# Patient Record
Sex: Female | Born: 1965 | Race: Black or African American | Hispanic: No | Marital: Married | Smoking: Never smoker
Health system: Southern US, Community
[De-identification: ages and names within clinical notes are randomized; demographics above are authoritative.]

## PROBLEM LIST (undated history)

## (undated) DIAGNOSIS — Z789 Other specified health status: Secondary | ICD-10-CM

## (undated) HISTORY — PX: TUBAL LIGATION: SHX77

---

## 1996-09-16 HISTORY — PX: FOOT SURGERY: SHX648

## 1997-09-16 HISTORY — PX: TUBAL LIGATION: SHX77

## 1998-02-06 ENCOUNTER — Other Ambulatory Visit: Admission: RE | Admit: 1998-02-06 | Discharge: 1998-02-06 | Payer: Self-pay | Admitting: Obstetrics & Gynecology

## 1998-06-21 ENCOUNTER — Inpatient Hospital Stay (HOSPITAL_COMMUNITY): Admission: AD | Admit: 1998-06-21 | Discharge: 1998-06-21 | Payer: Self-pay | Admitting: Obstetrics and Gynecology

## 1998-08-21 ENCOUNTER — Inpatient Hospital Stay (HOSPITAL_COMMUNITY): Admission: AD | Admit: 1998-08-21 | Discharge: 1998-08-23 | Payer: Self-pay | Admitting: *Deleted

## 1998-08-28 ENCOUNTER — Ambulatory Visit (HOSPITAL_COMMUNITY): Admission: RE | Admit: 1998-08-28 | Discharge: 1998-08-28 | Payer: Self-pay | Admitting: Obstetrics and Gynecology

## 1998-08-28 ENCOUNTER — Encounter: Payer: Self-pay | Admitting: Obstetrics and Gynecology

## 1999-02-19 ENCOUNTER — Other Ambulatory Visit: Admission: RE | Admit: 1999-02-19 | Discharge: 1999-02-19 | Payer: Self-pay | Admitting: *Deleted

## 1999-03-30 ENCOUNTER — Ambulatory Visit (HOSPITAL_COMMUNITY): Admission: RE | Admit: 1999-03-30 | Discharge: 1999-03-30 | Payer: Self-pay | Admitting: *Deleted

## 2003-04-06 ENCOUNTER — Emergency Department (HOSPITAL_COMMUNITY): Admission: EM | Admit: 2003-04-06 | Discharge: 2003-04-06 | Payer: Self-pay | Admitting: *Deleted

## 2003-05-10 ENCOUNTER — Encounter: Payer: Self-pay | Admitting: Family Medicine

## 2003-05-10 ENCOUNTER — Encounter: Admission: RE | Admit: 2003-05-10 | Discharge: 2003-05-10 | Payer: Self-pay | Admitting: Family Medicine

## 2005-11-21 ENCOUNTER — Other Ambulatory Visit: Admission: RE | Admit: 2005-11-21 | Discharge: 2005-11-21 | Payer: Self-pay | Admitting: Obstetrics and Gynecology

## 2010-10-06 ENCOUNTER — Encounter: Payer: Self-pay | Admitting: Family Medicine

## 2011-08-03 ENCOUNTER — Emergency Department (HOSPITAL_COMMUNITY)

## 2011-08-03 ENCOUNTER — Encounter: Payer: Self-pay | Admitting: *Deleted

## 2011-08-03 ENCOUNTER — Emergency Department (HOSPITAL_COMMUNITY)
Admission: EM | Admit: 2011-08-03 | Discharge: 2011-08-03 | Disposition: A | Discharge status: Home / Self Care | Attending: Emergency Medicine | Admitting: Emergency Medicine

## 2011-08-03 DIAGNOSIS — M25519 Pain in unspecified shoulder: Secondary | ICD-10-CM | POA: Insufficient documentation

## 2011-08-03 MED ORDER — ONDANSETRON HCL 4 MG/2ML IJ SOLN
4.0000 mg | Freq: Once | INTRAMUSCULAR | Status: AC
  Administered 2011-08-03: 4 mg via INTRAVENOUS
  Filled 2011-08-03: qty 2

## 2011-08-03 MED ORDER — HYDROMORPHONE HCL PF 1 MG/ML IJ SOLN
1.0000 mg | Freq: Once | INTRAMUSCULAR | Status: AC
  Administered 2011-08-03: 1 mg via INTRAVENOUS
  Filled 2011-08-03: qty 1

## 2011-08-03 MED ORDER — SODIUM CHLORIDE 0.9 % IV SOLN
Freq: Once | INTRAVENOUS | Status: AC
  Administered 2011-08-03: 19:00:00 via INTRAVENOUS

## 2011-08-03 MED ORDER — TETANUS-DIPHTH-ACELL PERTUSSIS 5-2.5-18.5 LF-MCG/0.5 IM SUSP
0.5000 mL | Freq: Once | INTRAMUSCULAR | Status: DC
  Filled 2011-08-03: qty 0.5

## 2011-08-03 NOTE — ED Notes (Signed)
Ice pack to affected area given for comfort
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
## 2011-08-03 NOTE — Progress Notes (Signed)
Orthopedic Tech Progress Note Patient Details:  EARLY ORD 24-Aug-1966 [id number]  Other Ortho Devices Ortho Device Location: applied shoulder immobilizer to (R) UE Ortho Device Interventions: Application   Jennye Moccasin 08/03/2011, 8:20 PM
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
## 2011-08-03 NOTE — ED Notes (Signed)
Patient transported to X-ray 

## 2011-08-04 NOTE — ED Provider Notes (Signed)
Medical screening examination/treatment/procedure(s) were performed by non-physician practitioner and as supervising physician I was immediately available for consultation/collaboration.  Flint Melter, MD 08/04/11 2118

## 2012-02-25 ENCOUNTER — Ambulatory Visit: Payer: Self-pay | Admitting: Obstetrics and Gynecology

## 2012-07-08 ENCOUNTER — Telehealth: Payer: Self-pay | Admitting: Obstetrics and Gynecology

## 2012-07-08 NOTE — Telephone Encounter (Signed)
Spoke with pt rgd msg pt states positive upt had btl 14 yrs ago wants eval py has appt 07/09/12 at 10:00 with vph pt voice understanding

## 2012-07-09 ENCOUNTER — Encounter: Payer: Self-pay | Admitting: Obstetrics and Gynecology

## 2012-07-09 ENCOUNTER — Telehealth: Payer: Self-pay | Admitting: Obstetrics and Gynecology

## 2012-07-09 ENCOUNTER — Other Ambulatory Visit: Payer: Self-pay | Admitting: Obstetrics and Gynecology

## 2012-07-09 ENCOUNTER — Ambulatory Visit (INDEPENDENT_AMBULATORY_CARE_PROVIDER_SITE_OTHER): Admitting: Obstetrics and Gynecology

## 2012-07-09 ENCOUNTER — Ambulatory Visit (INDEPENDENT_AMBULATORY_CARE_PROVIDER_SITE_OTHER)

## 2012-07-09 VITALS — BP 104/70 | Resp 16 | Ht 61.0 in | Wt 136.0 lb

## 2012-07-09 DIAGNOSIS — D219 Benign neoplasm of connective and other soft tissue, unspecified: Secondary | ICD-10-CM

## 2012-07-09 DIAGNOSIS — Z3201 Encounter for pregnancy test, result positive: Secondary | ICD-10-CM

## 2012-07-09 DIAGNOSIS — N912 Amenorrhea, unspecified: Secondary | ICD-10-CM

## 2012-07-09 DIAGNOSIS — D259 Leiomyoma of uterus, unspecified: Secondary | ICD-10-CM

## 2012-07-09 LAB — HCG, QUANTITATIVE, PREGNANCY: hCG, Beta Chain, Quant, S: <1 m[IU]/mL

## 2012-07-09 MED ORDER — MEDROXYPROGESTERONE ACETATE 5 MG PO TABS: 5.0000 mg | ORAL_TABLET | Freq: Every day | ORAL | Status: DC

## 2012-07-09 NOTE — Patient Instructions (Signed)
Uterine Fibroid  A uterine fibroid is a growth (tumor) that occurs in a woman's uterus. This type of tumor is not cancerous and does not spread out of the uterus. A woman can have one or many fibroids, and the fiboid(s) can become quite large. A fibroid can vary in size, weight, and where it grows in the uterus. Most fibroids do not require medical treatment, but some can cause pain or heavy bleeding during and between periods.  CAUSES   A fibroid is the result of a single uterine cell that keeps growing (unregulated), which is different than most cells in the human body. Most cells have a control mechanism that keeps them from reproducing without control.   SYMPTOMS    Bleeding.   Pelvic pain and pressure.   Bladder problems due to the size of the fibroid.   Infertility and miscarriages depending on the size and location of the fibroid.  DIAGNOSIS   A diagnosis is made by physical exam. Your caregiver may feel the lumpy tumors during a pelvic exam. Important information regarding size, location, and number of tumors can be gained by having an ultrasound. It is rare that other tests, such as a CT scan or MRI, are needed.  TREATMENT    Your caregiver may recommend watchful waiting. This involves getting the fibroid checked by your caregiver to see if the fibroids grow or shrink.    Hormonal treatment or an intrauterine device (IUD) may be prescribed.    Surgery may be needed to remove the fibroids (myomectomy) or the uterus (hysterectomy). This depends on your situation.  When fibroids interfere with fertility and a woman wants to become pregnant, a caregiver may recommend having the fibroids removed.   HOME CARE INSTRUCTIONS   Home care depends on how you were treated. In general:    Keep all follow-up appointments with your caregiver.    Only take medicine as told by your caregiver. Do not take aspirin. It can cause bleeding.    If you have excessive periods and soak tampons or pads in a half hour or  less, contact your caregiver immediately. If your periods are troublesome but not so heavy, lie down with your feet raised slightly above your heart. Place cold packs on your lower abdomen.    If your periods are heavy, write down the number of pads or tampons you use per month. Bring this information to your caregiver.    Talk to your caregiver about taking iron pills.    Include green vegetables in your diet.    If you were prescribed a hormonal treatment, take the hormonal medicines as directed.    If you need surgery, ask your caregiver for information on your specific surgery.   SEEK IMMEDIATE MEDICAL CARE IF:   You have pelvic pain or cramps not controlled with medicines.    You have a sudden increase in pelvic pain.    You have an increase of bleeding between and during periods.    You feel lightheaded or have fainting episodes.   MAKE SURE YOU:   Understand these instructions.   Will watch your condition.   Will get help right away if you are not doing well or get worse.  Document Released: 08/30/2000 Document Revised: 11/25/2011 Document Reviewed: 09/23/2011  [employer] Patient Information 2013 [employer].
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
## 2012-07-09 NOTE — Progress Notes (Signed)
Patient ID: Cassandra Willis, female   DOB: [birth year], 46 y.o.   MRN: [id number] Pt is here today because she had + UPT. However, the one done here today was negative. LMP was 06/12/2012. Pt had BTL many years ago.

## 2012-07-09 NOTE — Progress Notes (Signed)
GYN PROBLEM VISIT  Ms. Cassandra Willis is a 46 y.o. year old female, , who presents for a problem visit.   Subjective: Has always had nl regular cycles.  Now one week late.  Had pos HPT, then neg.  S/p BTL.  14 YRS ago    Objective:  BP 104/70  Resp 16  Ht 5\' 1"  (1.549 m)  Wt 136 lb (61.689 kg)  BMI 25.70 kg/m2  LMP 06/12/2012     External genitalia: normal general appearance Vaginal: normal rugae Cervix: normal appearance Adnexa: no masses Uterus: irregular enlargement 10 weeks size, mobile and irregular  ULTRASOUND: Uterus: Length: 9.97 cm   Width:  6.35 cm   Height:  6.98 cm  Endo thickness:  5.29 mm   Left ovary:Normal Right ovary:Normal Fibroids:yes  Number:  5  Size5.54cm  Largest diameter CDS fluid:no QHCG= <1.0  Assessment:  Oligomenorrhea Asymptomatic fibroid  Plan: Provera 5mg  for 5 days if no menses by 07/17/12  Return to office 3 mos for aex   Dierdre Forth, MD  07/09/2012 3:08 PM

## 2012-08-12 ENCOUNTER — Encounter: Admitting: Obstetrics and Gynecology

## 2014-04-06 ENCOUNTER — Ambulatory Visit (INDEPENDENT_AMBULATORY_CARE_PROVIDER_SITE_OTHER): Admitting: Family Medicine

## 2014-04-06 VITALS — BP 118/78 | HR 87 | Temp 98.0°F | Resp 16 | Ht 63.0 in | Wt 157.0 lb

## 2014-04-06 DIAGNOSIS — R209 Unspecified disturbances of skin sensation: Secondary | ICD-10-CM

## 2014-04-06 DIAGNOSIS — R202 Paresthesia of skin: Secondary | ICD-10-CM

## 2014-04-06 DIAGNOSIS — R300 Dysuria: Secondary | ICD-10-CM

## 2014-04-06 DIAGNOSIS — R8281 Pyuria: Secondary | ICD-10-CM

## 2014-04-06 DIAGNOSIS — R200 Anesthesia of skin: Secondary | ICD-10-CM

## 2014-04-06 DIAGNOSIS — R82998 Other abnormal findings in urine: Secondary | ICD-10-CM

## 2014-04-06 DIAGNOSIS — R109 Unspecified abdominal pain: Secondary | ICD-10-CM

## 2014-04-06 LAB — POCT URINALYSIS DIPSTICK
Bilirubin, UA: NEGATIVE
Glucose, UA: NEGATIVE
Nitrite, UA: POSITIVE
Protein, UA: 30
Spec Grav, UA: 1.02
Urobilinogen, UA: 0.2
pH, UA: 5

## 2014-04-06 LAB — POCT UA - MICROSCOPIC ONLY
Casts, Ur, LPF, POC: NEGATIVE
Crystals, Ur, HPF, POC: NEGATIVE
Mucus, UA: NEGATIVE
Yeast, UA: NEGATIVE

## 2014-04-06 MED ORDER — CIPROFLOXACIN HCL 500 MG PO TABS: 500.0000 mg | ORAL_TABLET | Freq: Two times a day (BID) | ORAL | Status: DC

## 2014-04-06 MED ORDER — PREDNISONE 20 MG PO TABS: 40.0000 mg | ORAL_TABLET | Freq: Every day | ORAL | Status: DC

## 2014-04-06 NOTE — Progress Notes (Signed)
Called in.

## 2014-04-06 NOTE — Progress Notes (Signed)
This chart was scribed for Robyn Haber, MD by Einar Pheasant, ED Scribe. This patient was seen in room 2 and the patient's care was started at 7:28 PM.  Patient ID: Cassandra Willis MRN: [id number], DOB: [birth year], 48 y.o. Date of Encounter: 04/06/2014, 7:26 PM  Primary Physician: Elyn Peers, MD  Chief Complaint:  Chief Complaint  Patient presents with   Back Pain    right side x 2 days   Urinary Frequency    x 3 weeks   Numbness    right knee x 2 months     HPI: 48 y.o. 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 Pt states that the urgency increase has been waking her up at night. She is also complaining of associated back pain that started 2 days ago. Pt states that the pain was so bad when it started that it was painful to the touch. She reports taking an Advil with relief. Denies any abdominal pain, nausea, emesis, chest pain, or SOB.    Pt is also complaining of right knee numbness that started 2 months ago. She states that when she puts pressure on its painful. Pt states that she has to lay on the left side of her body because its uncomfortable. Denies any recent injury or falls.    History reviewed. No pertinent past medical history.   Home Meds: Prior to Admission medications   Medication Sig Start Date End Date Taking? Authorizing Provider  medroxyPROGESTERone (PROVERA) 5 MG tablet Take 1 tablet (5 mg total) by mouth daily. 07/09/12   Eldred Manges, MD    Allergies: Not on File  History   Social History   Marital Status: Unknown    Spouse Name: N/A    Number of Children: N/A   Years of Education: N/A   Occupational History   Not on file.   Social History Main Topics   Smoking status: Never Smoker    Smokeless tobacco: Not on file   Alcohol Use: Not on file   Drug Use: Not on file   Sexual Activity: Not on file   Other  Topics Concern   Not on file   Social History Narrative   No narrative on file     Review of Systems: positive for arthralgias, urinary frequency, and back pain.  Constitutional: negative for chills, fever, night sweats, weight changes, or fatigue  HEENT: negative for vision changes, hearing loss, congestion, rhinorrhea, ST, epistaxis, or sinus pressure Cardiovascular: negative for chest pain or palpitations Respiratory: negative for hemoptysis, wheezing, shortness of breath, or cough Abdominal: negative for abdominal pain, nausea, vomiting, diarrhea, or constipation Dermatological: negative for rash Neurologic: negative for headache, dizziness, or syncope All other systems reviewed and are otherwise negative with the exception to those above and in the HPI.   Physical Exam: Blood pressure 118/78, pulse 87, temperature 98 F (36.7 C), temperature source Oral, resp. rate 16, height 5\' 3"  (1.6 m), weight 157 lb (71.215 kg), last menstrual period 03/17/2014, SpO2 98.00%., Body mass index is 27.82 kg/(m^2). General: Well developed, well nourished, in no acute distress. Head: Normocephalic, atraumatic, eyes without discharge, sclera non-icteric, nares are without discharge. Bilateral auditory canals clear, TM's are without perforation, pearly grey and translucent with reflective cone of light bilaterally. Oral cavity moist, posterior pharynx without exudate, erythema, peritonsillar abscess, or post nasal drip.  Neck: Supple. No thyromegaly. Full ROM. No lymphadenopathy.  Lungs: Clear bilaterally to auscultation without wheezes, rales, or rhonchi. Breathing is unlabored. Heart: RRR with S1 S2. No murmurs, rubs, or gallops appreciated. Abdomen: Soft, non-tender, non-distended with normoactive bowel sounds. No hepatomegaly. No rebound/guarding. No obvious abdominal masses. Msk:  Strength and tone normal for age. Extremities/Skin: Warm and dry. No clubbing or cyanosis. No edema. No rashes or  suspicious lesions. Neuro: Alert and oriented X 3. Moves all extremities spontaneously. Gait is normal. CNII-XII grossly in tact. Psych:  Responds to questions appropriately with a normal affect.   Results for orders placed in visit on 04/06/14  POCT URINALYSIS DIPSTICK      Result Value Ref Range   Color, UA yellow     Clarity, UA cloudy     Glucose, UA neg     Bilirubin, UA neg     Ketones, UA trace     Spec Grav, UA 1.020     Blood, UA trace     pH, UA 5.0     Protein, UA 30     Urobilinogen, UA 0.2     Nitrite, UA positive     Leukocytes, UA moderate (2+)    POCT UA - MICROSCOPIC ONLY      Result Value Ref Range   WBC, Ur, HPF, POC 25-30     RBC, urine, microscopic 1-3     Bacteria, U Microscopic 1+     Mucus, UA neg     Epithelial cells, urine per micros 1-3     Crystals, Ur, HPF, POC neg     Casts, Ur, LPF, POC neg     Yeast, UA neg      ASSESSMENT AND PLAN:  48 y.o. year old female with  Dysuria - Plan: POCT urinalysis dipstick, POCT UA - Microscopic Only, ciprofloxacin (CIPRO) 500 MG tablet  Pyuria - Plan: ciprofloxacin (CIPRO) 500 MG tablet  Numbness and tingling of both legs below knees - Plan: predniSONE (DELTASONE) 20 MG tablet  Flank pain - Plan: predniSONE (DELTASONE) 20 MG tablet      I personally performed the services described in this documentation, which was scribed in my presence. The recorded information has been reviewed and is accurate.  Signed, Robyn Haber, MD 04/06/2014 7:26 PM

## 2014-04-06 NOTE — Patient Instructions (Signed)

## 2014-04-09 LAB — URINE CULTURE: Colony Count: >=100000

## 2014-11-22 ENCOUNTER — Emergency Department (HOSPITAL_COMMUNITY): Payer: 59

## 2014-11-22 ENCOUNTER — Encounter (HOSPITAL_COMMUNITY): Payer: Self-pay | Admitting: *Deleted

## 2014-11-22 ENCOUNTER — Emergency Department (HOSPITAL_COMMUNITY)
Admission: EM | Admit: 2014-11-22 | Discharge: 2014-11-22 | Disposition: A | Discharge status: Home / Self Care | Payer: 59 | Attending: Emergency Medicine | Admitting: Emergency Medicine

## 2014-11-22 DIAGNOSIS — Y9389 Activity, other specified: Secondary | ICD-10-CM | POA: Insufficient documentation

## 2014-11-22 DIAGNOSIS — Z3202 Encounter for pregnancy test, result negative: Secondary | ICD-10-CM | POA: Diagnosis not present

## 2014-11-22 DIAGNOSIS — S40012A Contusion of left shoulder, initial encounter: Secondary | ICD-10-CM | POA: Insufficient documentation

## 2014-11-22 DIAGNOSIS — S199XXA Unspecified injury of neck, initial encounter: Secondary | ICD-10-CM | POA: Insufficient documentation

## 2014-11-22 DIAGNOSIS — S4992XA Unspecified injury of left shoulder and upper arm, initial encounter: Secondary | ICD-10-CM | POA: Diagnosis present

## 2014-11-22 DIAGNOSIS — Y998 Other external cause status: Secondary | ICD-10-CM | POA: Insufficient documentation

## 2014-11-22 DIAGNOSIS — Y92410 Unspecified street and highway as the place of occurrence of the external cause: Secondary | ICD-10-CM | POA: Insufficient documentation

## 2014-11-22 LAB — POC URINE PREG, ED: Preg Test, Ur: NEGATIVE

## 2014-11-22 MED ORDER — NAPROXEN 375 MG PO TABS: 375.0000 mg | ORAL_TABLET | Freq: Two times a day (BID) | ORAL | Status: DC

## 2014-11-22 MED ORDER — CYCLOBENZAPRINE HCL 10 MG PO TABS
5.0000 mg | ORAL_TABLET | Freq: Once | ORAL | Status: AC
  Administered 2014-11-22: 5 mg via ORAL
  Filled 2014-11-22: qty 1

## 2014-11-22 MED ORDER — CYCLOBENZAPRINE HCL 5 MG PO TABS: 5.0000 mg | ORAL_TABLET | Freq: Two times a day (BID) | ORAL | Status: DC | PRN

## 2014-11-22 MED ORDER — HYDROCODONE-ACETAMINOPHEN 5-325 MG PO TABS: 1.0000 | ORAL_TABLET | ORAL | Status: DC | PRN
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
## 2016-06-20 ENCOUNTER — Encounter (HOSPITAL_COMMUNITY): Payer: Self-pay | Admitting: *Deleted

## 2018-02-24 ENCOUNTER — Other Ambulatory Visit: Payer: Self-pay

## 2018-02-24 ENCOUNTER — Encounter (HOSPITAL_COMMUNITY): Payer: Self-pay

## 2018-02-24 ENCOUNTER — Emergency Department (HOSPITAL_COMMUNITY)
Admission: EM | Admit: 2018-02-24 | Discharge: 2018-02-24 | Disposition: A | Discharge status: Home / Self Care | Attending: Emergency Medicine | Admitting: Emergency Medicine

## 2018-02-24 DIAGNOSIS — G8929 Other chronic pain: Secondary | ICD-10-CM | POA: Diagnosis not present

## 2018-02-24 DIAGNOSIS — M545 Low back pain: Secondary | ICD-10-CM | POA: Diagnosis not present

## 2018-02-24 DIAGNOSIS — M549 Dorsalgia, unspecified: Secondary | ICD-10-CM | POA: Diagnosis present

## 2018-02-24 LAB — URINALYSIS, ROUTINE W REFLEX MICROSCOPIC
Bilirubin Urine: NEGATIVE
Glucose, UA: NEGATIVE mg/dL
HGB URINE DIPSTICK: NEGATIVE
Ketones, ur: NEGATIVE mg/dL
LEUKOCYTES UA: NEGATIVE
NITRITE: NEGATIVE
PROTEIN: NEGATIVE mg/dL
SPECIFIC GRAVITY, URINE: 1.015 (ref 1.005–1.030)
pH: 6 (ref 5.0–8.0)

## 2018-02-24 LAB — CBC WITH DIFFERENTIAL/PLATELET
BASOS ABS: 0 10*3/uL (ref 0.0–0.1)
Basophils Relative: 1 %
Eosinophils Absolute: 0.2 10*3/uL (ref 0.0–0.7)
Eosinophils Relative: 3 %
HEMATOCRIT: 40.8 % (ref 36.0–46.0)
Hemoglobin: 14 g/dL (ref 12.0–15.0)
LYMPHS ABS: 1.9 10*3/uL (ref 0.7–4.0)
Lymphocytes Relative: 30 %
MCH: 30.8 pg (ref 26.0–34.0)
MCHC: 34.3 g/dL (ref 30.0–36.0)
MCV: 89.7 fL (ref 78.0–100.0)
MONO ABS: 0.5 10*3/uL (ref 0.1–1.0)
Monocytes Relative: 8 %
NEUTROS ABS: 3.8 10*3/uL (ref 1.7–7.7)
Neutrophils Relative %: 58 %
Platelets: 309 10*3/uL (ref 150–400)
RBC: 4.55 MIL/uL (ref 3.87–5.11)
RDW: 13.3 % (ref 11.5–15.5)
WBC: 6.4 10*3/uL (ref 4.0–10.5)

## 2018-02-24 LAB — COMPREHENSIVE METABOLIC PANEL
ALBUMIN: 4.4 g/dL (ref 3.5–5.0)
ALT: 29 U/L (ref 14–54)
ANION GAP: 7 (ref 5–15)
AST: 26 U/L (ref 15–41)
Alkaline Phosphatase: 84 U/L (ref 38–126)
BILIRUBIN TOTAL: 0.4 mg/dL (ref 0.3–1.2)
BUN: 14 mg/dL (ref 6–20)
CHLORIDE: 106 mmol/L (ref 101–111)
CO2: 28 mmol/L (ref 22–32)
Calcium: 9.4 mg/dL (ref 8.9–10.3)
Creatinine, Ser: 0.97 mg/dL (ref 0.44–1.00)
GFR calc Af Amer: >60 mL/min (ref >60)
GFR calc non Af Amer: >60 mL/min (ref >60)
GLUCOSE: 106 mg/dL — AB (ref 65–99)
Potassium: 4.1 mmol/L (ref 3.5–5.1)
SODIUM: 141 mmol/L (ref 135–145)
TOTAL PROTEIN: 8 g/dL (ref 6.5–8.1)

## 2018-02-24 LAB — I-STAT BETA HCG BLOOD, ED (MC, WL, AP ONLY)

## 2018-02-24 NOTE — ED Provider Notes (Signed)
[facility] DEPT Provider Note   CSN: [id number] Arrival date & time: 02/24/18  1211     History   Chief Complaint Chief Complaint  Patient presents with  . Flank Pain    HPI Cassandra Willis is a 52 y.o. female.  52 yo female presents with complaint of right mid back pain x 1 year. Patient reports daily aching pain, worse with movement and lying in bed, improves with taking Aleve, does not radiate. Reports urinary frequency without dysuria. Also concerned her eyes look yellow and she has dark circles around her eyes. No other complaints or concerns.      History reviewed. No pertinent past medical history.  Patient Active Problem List   Diagnosis Date Noted  . Amenorrhea 07/09/2012    Past Surgical History:  Procedure Laterality Date  . FOOT SURGERY  1998  . TUBAL LIGATION  1999  . TUBAL LIGATION       OB History   None      Home Medications    Prior to Admission medications   Medication Sig Start Date End Date Taking? Authorizing Provider  naproxen sodium (ALEVE) 220 MG tablet Take 440 mg by mouth 2 (two) times daily as needed (back pain).   Yes [provider]    Family History No family history on file.  Social History Social History   Tobacco Use  . Smoking status: Never Smoker  . Smokeless tobacco: Never Used  Substance Use Topics  . Alcohol use: No  . Drug use: No     Allergies   Patient has no known allergies.   Review of Systems Review of Systems  Constitutional: Negative for fever.  Gastrointestinal: Negative for abdominal pain, constipation, diarrhea, nausea and vomiting.  Genitourinary: Positive for frequency. Negative for difficulty urinating and dysuria.  Musculoskeletal: Positive for back pain.  Skin: Negative for rash and wound.  Allergic/Immunologic: Negative for immunocompromised state.  Neurological: Negative for dizziness and weakness.  Hematological: Does not bruise/bleed  easily.  Psychiatric/Behavioral: Negative for confusion.  All other systems reviewed and are negative.    Physical Exam Updated Vital Signs BP (!) 124/97 (BP Location: Right Arm)   Pulse 61   Temp 98 F (36.7 C) (Oral)   Resp 18   Ht 5\' 1"  (1.549 m)   Wt 90.7 kg (200 lb)   SpO2 100%   BMI 37.79 kg/m   Physical Exam  Constitutional: She is oriented to person, place, and time. She appears well-developed and well-nourished.  HENT:  Head: Normocephalic.  Eyes: Conjunctivae are normal.  Cardiovascular: Normal rate, regular rhythm, normal heart sounds and intact distal pulses.  No murmur heard. Pulmonary/Chest: Effort normal and breath sounds normal. No respiratory distress.  Abdominal: Soft. She exhibits no distension. There is no tenderness.  Musculoskeletal: She exhibits tenderness.       Lumbar back: She exhibits tenderness. She exhibits no bony tenderness.       Back:  Neurological: She is alert and oriented to person, place, and time.  Skin: Skin is warm and dry. Capillary refill takes less than 2 seconds. No rash noted.  Psychiatric: She has a normal mood and affect. Her behavior is normal.  Nursing note and vitals reviewed.    ED Treatments / Results  Labs (all labs ordered are listed, but only abnormal results are displayed) Labs Reviewed  COMPREHENSIVE METABOLIC PANEL - Abnormal; Notable for the following components:      Result Value   Glucose, Bld 106 (*)  All other components within normal limits  URINALYSIS, ROUTINE W REFLEX MICROSCOPIC  CBC WITH DIFFERENTIAL/PLATELET  I-STAT BETA HCG BLOOD, ED ([facility], [facility], [facility] ONLY)    EKG None  Radiology No results found.  Procedures Procedures (including critical care time)  Medications Ordered in ED Medications - No data to display   Initial Impression / Assessment and Plan / ED Course  I have reviewed the triage vital signs and the nursing notes.  Pertinent labs & imaging results that were available during  my care of the patient were reviewed by me and considered in my medical decision making (see chart for details).  Clinical Course as of Feb 24 1745  Tue Feb 24, 2018  1611 52yo female with right mid/low back pain x 1 year without injury, concerned she may have a kidney infection, also concerned due to yellow look her her eyes and dark circles around the eyes. On exam, right low back TTP, no midline tenderness. UA normal, will check CBC/CMP for patient's concerns for yellowing of the eyes not appreciated on exam.    [LM]  1611 Labs normal, patient will be dc to follow up with PCP for further evaluation.    [LM]    Clinical Course User Index [LM] Tacy Learn, PA-C      Final Clinical Impressions(s) / ED Diagnoses   Final diagnoses:  Chronic right-sided low back pain without sciatica    ED Discharge Orders    None       Tacy Learn, PA-C 02/24/18 1746    Mesner, Corene Cornea, MD 02/25/18 1641

## 2018-02-24 NOTE — ED Triage Notes (Addendum)
Right sided flank pain. Pt states that it's been waking her up. Pt concerned for kidney infection. No frequency/burning during urination. Pt states also some yellow in and around her eyes.

## 2018-02-24 NOTE — Discharge Instructions (Signed)
Continue with Naproxen (Aleve) as needed as directed for pain. Follow up with your GYN or contact your insurance provider as discussed for help locating PCP.  Discuss follow up with GYN/PCP- physical therapy or other treatment for your pain.

## 2018-11-20 ENCOUNTER — Ambulatory Visit (HOSPITAL_BASED_OUTPATIENT_CLINIC_OR_DEPARTMENT_OTHER)
Admit: 2018-11-20 | Discharge: 2018-11-20 | Disposition: A | Discharge status: Home / Self Care | Attending: Cardiology | Admitting: Cardiology

## 2018-11-20 ENCOUNTER — Ambulatory Visit (HOSPITAL_COMMUNITY)
Admission: EM | Admit: 2018-11-20 | Discharge: 2018-11-20 | Disposition: A | Discharge status: Home / Self Care | Attending: Family Medicine | Admitting: Family Medicine

## 2018-11-20 ENCOUNTER — Encounter (HOSPITAL_COMMUNITY): Payer: Self-pay | Admitting: Emergency Medicine

## 2018-11-20 ENCOUNTER — Ambulatory Visit (INDEPENDENT_AMBULATORY_CARE_PROVIDER_SITE_OTHER)

## 2018-11-20 DIAGNOSIS — M25461 Effusion, right knee: Secondary | ICD-10-CM

## 2018-11-20 DIAGNOSIS — M25561 Pain in right knee: Secondary | ICD-10-CM | POA: Diagnosis not present

## 2018-11-20 DIAGNOSIS — M79604 Pain in right leg: Secondary | ICD-10-CM | POA: Insufficient documentation

## 2018-11-20 DIAGNOSIS — M7989 Other specified soft tissue disorders: Secondary | ICD-10-CM | POA: Diagnosis not present

## 2018-11-20 DIAGNOSIS — M79609 Pain in unspecified limb: Secondary | ICD-10-CM

## 2018-11-20 MED ORDER — MELOXICAM 7.5 MG PO TABS: 7.5000 mg | ORAL_TABLET | Freq: Every day | ORAL | 0 refills | Status: DC

## 2018-11-20 NOTE — ED Provider Notes (Signed)
[place]    CSN: [id number] Arrival date & time: 11/20/18  1330     History   Chief Complaint Chief Complaint  Patient presents with  . Leg Pain    HPI Cassandra Willis is a 53 y.o. female.   Cassandra Willis presents with complaints of right knee pain and right calf pain. Symptoms initially started 3 weeks ago with generalized right pain which localized to right lateral knee. This progressed to posterior knee pain and right calf pain and swelling. She noted bruising to posterior knee and visible swelling. No numbness or tingling. Has been taking tylenol which hasn't helped. No known injury. No cough or shortness of breath , no chest pain . No recent travel, no recent hospitalization, not on any hormone therapy. Doesn't smoke. No history of blood clots. Pain is worse with weight bearing. States knee pain is worse when she wakes in the morning, it is sharp. Improves with activity. Denies any previous similar. No skin breakdown or lesions. Without contributing medical history.      ROS per HPI, negative if not otherwise mentioned.      History reviewed. No pertinent past medical history.  Patient Active Problem List   Diagnosis Date Noted  . Amenorrhea 07/09/2012    Past Surgical History:  Procedure Laterality Date  . FOOT SURGERY  1998  . TUBAL LIGATION  1999  . TUBAL LIGATION      OB History   No obstetric history on file.      Home Medications    Prior to Admission medications   Medication Sig Start Date End Date Taking? Authorizing Provider  meloxicam (MOBIC) 7.5 MG tablet Take 1 tablet (7.5 mg total) by mouth daily. 11/20/18   Zigmund Gottron, NP  naproxen sodium (ALEVE) 220 MG tablet Take 440 mg by mouth 2 (two) times daily as needed (back pain).    [provider]    Family History Family History  Problem Relation Age of Onset  . Hypertension Mother     Social History Social History   Tobacco Use  . Smoking status: Never Smoker   . Smokeless tobacco: Never Used  Substance Use Topics  . Alcohol use: No  . Drug use: No     Allergies   Patient has no known allergies.   Review of Systems Review of Systems   Physical Exam Triage Vital Signs ED Triage Vitals [11/20/18 1424]  Enc Vitals Group     BP (!) 147/65     Pulse Rate 82     Resp 18     Temp 98.3 F (36.8 C)     Temp Source Temporal     SpO2 100 %     Weight      Height      Head Circumference      Peak Flow      Pain Score 7     Pain Loc      Pain Edu?      Excl. in [place]?    No data found.  Updated Vital Signs BP (!) 147/65 (BP Location: Right Arm)   Pulse 82   Temp 98.3 F (36.8 C) (Temporal)   Resp 18   SpO2 100%    Physical Exam Constitutional:      General: She is not in acute distress.    Appearance: She is well-developed.  Cardiovascular:     Rate and Rhythm: Normal rate and regular rhythm.     Heart sounds: Normal heart  sounds.  Pulmonary:     Effort: Pulmonary effort is normal. No respiratory distress.     Breath sounds: Normal breath sounds.  Musculoskeletal:     Right knee: She exhibits bony tenderness. She exhibits normal range of motion, no swelling, no laceration and no LCL laxity. Tenderness found. Lateral joint line tenderness noted.     Right lower leg: She exhibits tenderness and swelling. She exhibits no bony tenderness, no deformity and no laceration. No edema.     Comments: Bruising noted to right posterior fossa of right knee with tenderness to right lateral knee; pain with weight bearing noted with altered gait; tenderness to proximal calf with mild swelling noted; no pain with foot dorsiflexion; no redness, warmth or firmness; circumference of right calf 16.2 inch, 15.75 to left  Skin:    General: Skin is warm and dry.  Neurological:     Mental Status: She is alert and oriented to person, place, and time.      UC Treatments / Results  Labs (all labs ordered are listed, but only abnormal results are  displayed) Labs Reviewed - No data to display  EKG None  Radiology Vas [place] Lower Extremity Venous (dvt)  Result Date: 11/20/2018  Lower Venous Study Indications: Pain.  Performing Technologist: Oliver Hum RVT  Examination Guidelines: A complete evaluation includes B-mode imaging, spectral Doppler, color Doppler, and power Doppler as needed of all accessible portions of each vessel. Bilateral testing is considered an integral part of a complete examination. Limited examinations for reoccurring indications may be performed as noted.  Right Venous Findings: +---------+---------------+---------+-----------+----------+-------+          CompressibilityPhasicitySpontaneityPropertiesSummary +---------+---------------+---------+-----------+----------+-------+ CFV      Full           Yes      Yes                          +---------+---------------+---------+-----------+----------+-------+ SFJ      Full                                                 +---------+---------------+---------+-----------+----------+-------+ FV Prox  Full                                                 +---------+---------------+---------+-----------+----------+-------+ FV Mid   Full                                                 +---------+---------------+---------+-----------+----------+-------+ FV DistalFull                                                 +---------+---------------+---------+-----------+----------+-------+ PFV      Full                                                 +---------+---------------+---------+-----------+----------+-------+  POP      Full           Yes      Yes                          +---------+---------------+---------+-----------+----------+-------+ PTV      Full                                                 +---------+---------------+---------+-----------+----------+-------+ PERO     Full                                                  +---------+---------------+---------+-----------+----------+-------+  Left Venous Findings: +---+---------------+---------+-----------+----------+-------+    CompressibilityPhasicitySpontaneityPropertiesSummary +---+---------------+---------+-----------+----------+-------+ CFVFull           Yes      Yes                          +---+---------------+---------+-----------+----------+-------+    Summary: Right: There is no evidence of deep vein thrombosis in the lower extremity. No cystic structure found in the popliteal fossa. Intramuscular fluid of an unknown etiology. Left: No evidence of common femoral vein obstruction.  *See table(s) above for measurements and observations.    Preliminary    Dg Knee Ap/lat W/sunrise Right  Result Date: 11/20/2018 CLINICAL DATA:  Pain and swelling EXAM: RIGHT KNEE 3 VIEWS COMPARISON:  None. FINDINGS: No fracture or malalignment. Joint spaces are maintained. Small knee effusion IMPRESSION: No acute osseous abnormality.  Small knee effusion Electronically Signed   By: Donavan Foil M.D.   On: 11/20/2018 15:52    Procedures Procedures (including critical care time)  Medications Ordered in UC Medications - No data to display  Initial Impression / Assessment and Plan / UC Course  I have reviewed the triage vital signs and the nursing notes.  Pertinent labs & imaging results that were available during my care of the patient were reviewed by me and considered in my medical decision making (see chart for details).     Negative DVT study but with fluid to musculature. Strain vs ruptured bakers cyst? Small effusion to right knee. Ice, elevation, nsaids. Ace wraps provided. Follow up with ortho as needed. Results discussed with patient and agreeable to plan.    Final Clinical Impressions(s) / UC Diagnoses   Final diagnoses:  Right leg pain     Discharge Instructions     Please go now to get your leg ultrasound completed.  I will call you with  any concerning findings with your xray.  If negative ultrasound please start daily meloxicam, ice, elevation.  May follow up with orthopedics as needed.  We will notify you if any positive findings on ultrasound.      ED Prescriptions    Medication Sig Dispense Auth. Provider   meloxicam (MOBIC) 7.5 MG tablet Take 1 tablet (7.5 mg total) by mouth daily. 20 tablet Zigmund Gottron, NP     Controlled Substance Prescriptions [place] Controlled Substance Registry consulted? Not Applicable   Zigmund Gottron, NP 11/20/18 [id number]

## 2018-11-20 NOTE — Discharge Instructions (Addendum)
Please go now to get your leg ultrasound completed.  I will call you with any concerning findings with your xray.  If negative ultrasound please start daily meloxicam, ice, elevation.  May follow up with orthopedics as needed.  We will notify you if any positive findings on ultrasound.

## 2018-11-20 NOTE — ED Triage Notes (Signed)
Pt sts right leg pain x 3 weeks starting with pain in knee then moved around to back of knee and now down calf

## 2018-11-20 NOTE — Progress Notes (Signed)
Right lower extremity venous duplex has been completed. Preliminary results can be found in CV Proc through chart review.  Results were given to [facility] at [employer] office.  11/20/18 4:40 PM Cassandra Willis RVT

## 2019-06-21 NOTE — Pre-Procedure Instructions (Signed)
[facility] [phone] - [place], [place] - [place] [address] Phone: [phone] Fax: [phone]  [id number] - [address], [place] [address] Phone: [phone] Fax: [phone]  [facility] [place], [place] [address] Phone: [phone] Fax: [phone]      Your procedure is scheduled on 06-29-19  Report to [facility] Main Entrance "A" at 0530 A.M., and check in at the Admitting office.  Call this number if you have problems the morning of surgery:  [phone]  Call [phone] if you have any questions prior to your surgery date Monday-Friday 8am-4pm    Remember:  Do not eat after midnight the night before your surgery  You may drink clear liquids until 0430AM the morning of your surgery.   Clear liquids allowed are: Water, Non-Citrus Juices (without pulp), Carbonated Beverages, Clear Tea, Black Coffee Only, and Gatorade  Please complete your PRE-SURGERY ENSURE that was provided to you by 0430AM. the morning of surgery.  Please, if able, drink it in one setting. DO NOT SIP.    Take these medicines the morning of surgery with A SIP OF WATER : acetaminophen (TYLENOL)as needed  7 days prior to surgery STOP taking any MOBIC , Aspirin (unless otherwise instructed by your surgeon), Aleve, Naproxen, Ibuprofen, Motrin, Advil, Goody's, BC's, all herbal medications, fish oil, and all vitamins.    The Morning of Surgery  Do not wear jewelry, make-up or nail polish.  Do not wear lotions, powders, or perfumes, or deodorant  Do not shave 48 hours prior to surgery.    Do not bring valuables to the hospital.  [facility] is not responsible for any belongings or valuables.  If you are a smoker, DO NOT Smoke 24 hours prior to surgery IF you wear a  CPAP at night please bring your mask, tubing, and machine the morning of surgery   Remember that you must have someone to transport you home after your surgery, and remain with you for 24 hours if you are discharged the same day.   Contacts, glasses, hearing aids, dentures or bridgework may not be worn into surgery.    Leave your suitcase in the car.  After surgery it may be brought to your room.  For patients admitted to the hospital, discharge time will be determined by your treatment team.  Patients discharged the day of surgery will not be allowed to drive home.    Special instructions:   [facility]- Preparing For Surgery  Before surgery, you can play an important role. Because skin is not sterile, your skin needs to be as free of germs as possible. You can reduce the number of germs on your skin by washing with CHG (chlorahexidine gluconate) Soap before surgery.  CHG is an antiseptic cleaner which kills germs and bonds with the skin to continue killing germs even after washing.    Oral Hygiene is also important to reduce your risk of infection.  Remember - BRUSH YOUR TEETH THE MORNING OF SURGERY WITH YOUR REGULAR TOOTHPASTE  Please do not use if you have an allergy to CHG or antibacterial soaps. If your skin becomes reddened/irritated stop using the CHG.  Do not shave (including legs and underarms) for at least 48 hours prior to first CHG shower. It  is OK to shave your face.  Please follow these instructions carefully.   1. Shower the NIGHT BEFORE SURGERY and the MORNING OF SURGERY with CHG Soap.   2. If you chose to wash your hair, wash your hair first as usual with your normal shampoo.  3. After you shampoo, rinse your hair and body thoroughly to remove the shampoo.  4. Use CHG as you would any other liquid soap. You can apply CHG directly to the skin and wash gently with a scrungie or a clean washcloth.   5. Apply the CHG Soap to your body ONLY FROM THE NECK DOWN.  Do not  use on open wounds or open sores. Avoid contact with your eyes, ears, mouth and genitals (private parts). Wash Face and genitals (private parts)  with your normal soap.   6. Wash thoroughly, paying special attention to the area where your surgery will be performed.  7. Thoroughly rinse your body with warm water from the neck down.  8. DO NOT shower/wash with your normal soap after using and rinsing off the CHG Soap.  9. Pat yourself dry with a CLEAN TOWEL.  10. Wear CLEAN PAJAMAS to bed the night before surgery, wear comfortable clothes the morning of surgery  11. Place CLEAN SHEETS on your bed the night of your first shower and DO NOT SLEEP WITH PETS.    Day of Surgery:  Do not apply any deodorants/lotions. Please shower the morning of surgery with the CHG soap  Please wear clean clothes to the hospital/surgery center.   Remember to brush your teeth WITH YOUR REGULAR TOOTHPASTE.   Please read over the  fact sheets that you were given.

## 2019-06-22 ENCOUNTER — Other Ambulatory Visit: Payer: Self-pay

## 2019-06-22 ENCOUNTER — Encounter (HOSPITAL_COMMUNITY)
Admission: RE | Admit: 2019-06-22 | Discharge: 2019-06-22 | Disposition: A | Discharge status: Home / Self Care | Source: Ambulatory Visit | Attending: Obstetrics and Gynecology | Admitting: Obstetrics and Gynecology

## 2019-06-22 ENCOUNTER — Encounter (HOSPITAL_COMMUNITY): Payer: Self-pay

## 2019-06-22 DIAGNOSIS — Z01812 Encounter for preprocedural laboratory examination: Secondary | ICD-10-CM | POA: Diagnosis not present

## 2019-06-22 DIAGNOSIS — D219 Benign neoplasm of connective and other soft tissue, unspecified: Secondary | ICD-10-CM

## 2019-06-22 HISTORY — DX: Other specified health status: Z78.9

## 2019-06-22 LAB — CBC
HCT: 43.1 % (ref 36.0–46.0)
Hemoglobin: 14.7 g/dL (ref 12.0–15.0)
MCH: 30.7 pg (ref 26.0–34.0)
MCHC: 34.1 g/dL (ref 30.0–36.0)
MCV: 90 fL (ref 80.0–100.0)
Platelets: 346 10*3/uL (ref 150–400)
RBC: 4.79 MIL/uL (ref 3.87–5.11)
RDW: 13.1 % (ref 11.5–15.5)
WBC: 4.7 10*3/uL (ref 4.0–10.5)
nRBC: 0 % (ref 0.0–0.2)

## 2019-06-22 LAB — TYPE AND SCREEN
ABO/RH(D): B POS
Antibody Screen: NEGATIVE

## 2019-06-22 LAB — ABO/RH: ABO/RH(D): B POS

## 2019-06-25 ENCOUNTER — Other Ambulatory Visit (HOSPITAL_COMMUNITY)
Admission: RE | Admit: 2019-06-25 | Discharge: 2019-06-25 | Disposition: A | Discharge status: Home / Self Care | Source: Ambulatory Visit | Attending: Obstetrics and Gynecology | Admitting: Obstetrics and Gynecology

## 2019-06-25 DIAGNOSIS — Z20828 Contact with and (suspected) exposure to other viral communicable diseases: Secondary | ICD-10-CM | POA: Diagnosis not present

## 2019-06-25 DIAGNOSIS — Z01812 Encounter for preprocedural laboratory examination: Secondary | ICD-10-CM | POA: Diagnosis not present

## 2019-06-26 LAB — NOVEL CORONAVIRUS, NAA (HOSP ORDER, SEND-OUT TO REF LAB; TAT 18-24 HRS): SARS-CoV-2, NAA: NOT DETECTED

## 2019-06-28 NOTE — Anesthesia Preprocedure Evaluation (Addendum)
Anesthesia Evaluation  Patient identified by MRN, date of birth, ID band Patient awake    Reviewed: Allergy & Precautions, NPO status , Patient's Chart, lab work & pertinent test results  Airway Mallampati: II  TM Distance: >3 FB Neck ROM: Full    Dental  (+) Dental Advisory Given   Pulmonary neg pulmonary ROS,    Pulmonary exam normal breath sounds clear to auscultation       Cardiovascular negative cardio ROS Normal cardiovascular exam Rhythm:Regular Rate:Normal     Neuro/Psych negative neurological ROS  negative psych ROS   GI/Hepatic negative GI ROS, Neg liver ROS,   Endo/Other  Morbid obesity  Renal/GU negative Renal ROS     Musculoskeletal negative musculoskeletal ROS (+)   Abdominal (+) + obese,   Peds  Hematology negative hematology ROS (+)   Anesthesia Other Findings   Reproductive/Obstetrics negative OB ROS                           Anesthesia Physical Anesthesia Plan  ASA: III  Anesthesia Plan: General   Post-op Pain Management:    Induction: Intravenous  PONV Risk Score and Plan: 4 or greater and Ondansetron, Treatment may vary due to age or medical condition, Dexamethasone and Midazolam  Airway Management Planned: Oral ETT  Additional Equipment: None  Intra-op Plan:   Post-operative Plan: Extubation in OR  Informed Consent: I have reviewed the patients History and Physical, chart, labs and discussed the procedure including the risks, benefits and alternatives for the proposed anesthesia with the patient or authorized representative who has indicated his/her understanding and acceptance.     Dental advisory given  Plan Discussed with: CRNA  Anesthesia Plan Comments:        Anesthesia Quick Evaluation

## 2019-06-28 NOTE — H&P (Addendum)
Cassandra Willis is an 53 y.o. female presenting for surgical mngt of symptomatic fibroids.      Menstrual History: No LMP recorded. (Menstrual status: Other).    Past Medical History:  Diagnosis Date  . Medical history non-contributory     Past Surgical History:  Procedure Laterality Date  . FOOT SURGERY  1998  . TUBAL LIGATION  1999  . TUBAL LIGATION      Family History  Problem Relation Age of Onset  . Hypertension Mother     Social History:  reports that she has never smoked. She has never used smokeless tobacco. She reports that she does not drink alcohol or use drugs.  Allergies: No Known Allergies  No medications prior to admission.    ROS  AF, VSS Physical Exam  Gen - NAD CV - RRR Lungs - clear Abd - soft, NT PV - uterus enlarged, 18-20 wks  PV [place]:  Multiple (>10)  fibroids - the largest measuring 6.0 x 6.3cm.  Simple left ovarian cyst.    Assessment/Plan:  Fibroids TAH/BS - pt understands risks of bleeding, infection, damage to surrounding organs.  Discussed risk of blood transfusion. Discussed risk of vertical incision.   R/b/a discussed, questions answered, informed consent obtained.    Cassandra Willis 06/28/2019, 4:18 PM

## 2019-06-29 ENCOUNTER — Inpatient Hospital Stay (HOSPITAL_COMMUNITY): Admitting: Certified Registered"

## 2019-06-29 ENCOUNTER — Encounter (HOSPITAL_COMMUNITY): Payer: Self-pay | Admitting: General Practice

## 2019-06-29 ENCOUNTER — Encounter (HOSPITAL_COMMUNITY)
Admission: RE | Disposition: A | Discharge status: Home / Self Care | Payer: Self-pay | Source: Home / Self Care | Attending: Obstetrics and Gynecology

## 2019-06-29 ENCOUNTER — Inpatient Hospital Stay (HOSPITAL_COMMUNITY)
Admission: RE | Admit: 2019-06-29 | Discharge: 2019-06-30 | DRG: 743 | Disposition: A | Discharge status: Home / Self Care | Attending: Obstetrics and Gynecology | Admitting: Obstetrics and Gynecology

## 2019-06-29 ENCOUNTER — Other Ambulatory Visit: Payer: Self-pay

## 2019-06-29 DIAGNOSIS — Z6839 Body mass index (BMI) 39.0-39.9, adult: Secondary | ICD-10-CM | POA: Diagnosis not present

## 2019-06-29 DIAGNOSIS — D259 Leiomyoma of uterus, unspecified: Secondary | ICD-10-CM | POA: Diagnosis present

## 2019-06-29 DIAGNOSIS — D219 Benign neoplasm of connective and other soft tissue, unspecified: Secondary | ICD-10-CM | POA: Diagnosis present

## 2019-06-29 HISTORY — PX: ABDOMINAL HYSTERECTOMY: SHX81

## 2019-06-29 LAB — POCT PREGNANCY, URINE: Preg Test, Ur: NEGATIVE

## 2019-06-29 SURGERY — HYSTERECTOMY, ABDOMINAL
Anesthesia: General | Site: Abdomen | Laterality: Bilateral

## 2019-06-29 MED ORDER — DEXAMETHASONE SODIUM PHOSPHATE 10 MG/ML IJ SOLN
INTRAMUSCULAR | Status: AC
  Filled 2019-06-29: qty 1

## 2019-06-29 MED ORDER — HEMOSTATIC AGENTS (NO CHARGE) OPTIME
TOPICAL | Status: DC | PRN
  Administered 2019-06-29: 1 via TOPICAL

## 2019-06-29 MED ORDER — OXYCODONE HCL 5 MG PO TABS: 5.0000 mg | ORAL_TABLET | Freq: Once | ORAL | Status: DC | PRN

## 2019-06-29 MED ORDER — ROCURONIUM BROMIDE 10 MG/ML (PF) SYRINGE
PREFILLED_SYRINGE | INTRAVENOUS | Status: AC
  Filled 2019-06-29: qty 10

## 2019-06-29 MED ORDER — NALOXONE HCL 0.4 MG/ML IJ SOLN: 0.4000 mg | INTRAMUSCULAR | Status: DC | PRN

## 2019-06-29 MED ORDER — ONDANSETRON HCL 4 MG/2ML IJ SOLN
INTRAMUSCULAR | Status: DC | PRN
  Administered 2019-06-29: 4 mg via INTRAVENOUS

## 2019-06-29 MED ORDER — HYDROMORPHONE HCL 1 MG/ML IJ SOLN
0.5000 mg | INTRAMUSCULAR | Status: DC | PRN
  Administered 2019-06-29: 0.5 mg via INTRAVENOUS
  Filled 2019-06-29: qty 1

## 2019-06-29 MED ORDER — ROCURONIUM BROMIDE 10 MG/ML (PF) SYRINGE
PREFILLED_SYRINGE | INTRAVENOUS | Status: DC | PRN
  Administered 2019-06-29: 60 mg via INTRAVENOUS

## 2019-06-29 MED ORDER — PROPOFOL 10 MG/ML IV BOLUS
INTRAVENOUS | Status: AC
  Filled 2019-06-29: qty 20

## 2019-06-29 MED ORDER — LIDOCAINE 2% (20 MG/ML) 5 ML SYRINGE
INTRAMUSCULAR | Status: AC
  Filled 2019-06-29: qty 5

## 2019-06-29 MED ORDER — DOCUSATE SODIUM 100 MG PO CAPS
100.0000 mg | ORAL_CAPSULE | Freq: Two times a day (BID) | ORAL | Status: DC
  Administered 2019-06-29 – 2019-06-30 (×2): 100 mg via ORAL
  Filled 2019-06-29 (×2): qty 1

## 2019-06-29 MED ORDER — DIPHENHYDRAMINE HCL 50 MG/ML IJ SOLN: 12.5000 mg | Freq: Four times a day (QID) | INTRAMUSCULAR | Status: DC | PRN

## 2019-06-29 MED ORDER — OXYCODONE-ACETAMINOPHEN 5-325 MG PO TABS
1.0000 | ORAL_TABLET | ORAL | Status: DC | PRN
  Administered 2019-06-29: 12:00:00 2 via ORAL
  Filled 2019-06-29: qty 2

## 2019-06-29 MED ORDER — DEXTROSE IN LACTATED RINGERS 5 % IV SOLN
INTRAVENOUS | Status: DC
  Administered 2019-06-29 – 2019-06-30 (×3): via INTRAVENOUS

## 2019-06-29 MED ORDER — ONDANSETRON HCL 4 MG PO TABS: 4.0000 mg | ORAL_TABLET | Freq: Four times a day (QID) | ORAL | Status: DC | PRN

## 2019-06-29 MED ORDER — SODIUM CHLORIDE 0.9 % IR SOLN
Status: DC | PRN
  Administered 2019-06-29: 2000 mL

## 2019-06-29 MED ORDER — PROMETHAZINE HCL 25 MG/ML IJ SOLN: 6.2500 mg | INTRAMUSCULAR | Status: DC | PRN

## 2019-06-29 MED ORDER — SUGAMMADEX SODIUM 500 MG/5ML IV SOLN
INTRAVENOUS | Status: DC | PRN
  Administered 2019-06-29: 190 mg via INTRAVENOUS

## 2019-06-29 MED ORDER — LACTATED RINGERS IV SOLN
INTRAVENOUS | Status: DC | PRN
  Administered 2019-06-29: 07:00:00 via INTRAVENOUS

## 2019-06-29 MED ORDER — SIMETHICONE 80 MG PO CHEW: 80.0000 mg | CHEWABLE_TABLET | Freq: Four times a day (QID) | ORAL | Status: DC | PRN

## 2019-06-29 MED ORDER — ONDANSETRON HCL 4 MG/2ML IJ SOLN
4.0000 mg | Freq: Four times a day (QID) | INTRAMUSCULAR | Status: DC | PRN
  Filled 2019-06-29: qty 2

## 2019-06-29 MED ORDER — KETOROLAC TROMETHAMINE 30 MG/ML IJ SOLN
30.0000 mg | Freq: Three times a day (TID) | INTRAMUSCULAR | Status: DC
  Administered 2019-06-29 – 2019-06-30 (×4): 30 mg via INTRAVENOUS
  Filled 2019-06-29 (×3): qty 1

## 2019-06-29 MED ORDER — MORPHINE SULFATE 2 MG/ML IV SOLN
INTRAVENOUS | Status: DC
  Administered 2019-06-29: 14:00:00 via INTRAVENOUS
  Administered 2019-06-29: 18 mg via INTRAVENOUS
  Administered 2019-06-30: 0 mg via INTRAVENOUS
  Administered 2019-06-30: 2 mg via INTRAVENOUS
  Filled 2019-06-29 (×2): qty 30

## 2019-06-29 MED ORDER — PROPOFOL 10 MG/ML IV BOLUS
INTRAVENOUS | Status: DC | PRN
  Administered 2019-06-29: 200 mg via INTRAVENOUS

## 2019-06-29 MED ORDER — SODIUM CHLORIDE 0.9 % IV SOLN
2.0000 g | INTRAVENOUS | Status: AC
  Administered 2019-06-29: 2 g via INTRAVENOUS
  Filled 2019-06-29: qty 2

## 2019-06-29 MED ORDER — KETOROLAC TROMETHAMINE 30 MG/ML IJ SOLN
INTRAMUSCULAR | Status: AC
  Administered 2019-06-29: 15:00:00 30 mg
  Filled 2019-06-29: qty 1

## 2019-06-29 MED ORDER — FENTANYL CITRATE (PF) 250 MCG/5ML IJ SOLN
INTRAMUSCULAR | Status: AC
  Filled 2019-06-29: qty 5

## 2019-06-29 MED ORDER — MENTHOL 3 MG MT LOZG: 1.0000 | LOZENGE | OROMUCOSAL | Status: DC | PRN

## 2019-06-29 MED ORDER — SODIUM CHLORIDE 0.9% FLUSH: 9.0000 mL | INTRAVENOUS | Status: DC | PRN

## 2019-06-29 MED ORDER — MIDAZOLAM HCL 2 MG/2ML IJ SOLN
INTRAMUSCULAR | Status: AC
  Filled 2019-06-29: qty 2

## 2019-06-29 MED ORDER — ONDANSETRON HCL 4 MG/2ML IJ SOLN
INTRAMUSCULAR | Status: AC
  Filled 2019-06-29: qty 2

## 2019-06-29 MED ORDER — MEPERIDINE HCL 25 MG/ML IJ SOLN: 6.2500 mg | INTRAMUSCULAR | Status: DC | PRN

## 2019-06-29 MED ORDER — HYDROMORPHONE HCL 1 MG/ML IJ SOLN
0.2500 mg | INTRAMUSCULAR | Status: DC | PRN
  Administered 2019-06-29: 10:00:00 0.5 mg via INTRAVENOUS

## 2019-06-29 MED ORDER — HYDROMORPHONE HCL 1 MG/ML IJ SOLN
INTRAMUSCULAR | Status: AC
  Filled 2019-06-29: qty 1

## 2019-06-29 MED ORDER — DIPHENHYDRAMINE HCL 12.5 MG/5ML PO ELIX: 12.5000 mg | ORAL_SOLUTION | Freq: Four times a day (QID) | ORAL | Status: DC | PRN

## 2019-06-29 MED ORDER — CHLORHEXIDINE GLUCONATE CLOTH 2 % EX PADS
6.0000 | MEDICATED_PAD | Freq: Every day | CUTANEOUS | Status: DC
  Administered 2019-06-29: 18:00:00 6 via TOPICAL

## 2019-06-29 MED ORDER — ONDANSETRON HCL 4 MG/2ML IJ SOLN
4.0000 mg | Freq: Four times a day (QID) | INTRAMUSCULAR | Status: DC | PRN
  Administered 2019-06-29: 4 mg via INTRAVENOUS

## 2019-06-29 MED ORDER — LIDOCAINE 2% (20 MG/ML) 5 ML SYRINGE
INTRAMUSCULAR | Status: DC | PRN
  Administered 2019-06-29: 100 mg via INTRAVENOUS

## 2019-06-29 MED ORDER — OXYCODONE HCL 5 MG/5ML PO SOLN: 5.0000 mg | Freq: Once | ORAL | Status: DC | PRN

## 2019-06-29 MED ORDER — DEXAMETHASONE SODIUM PHOSPHATE 10 MG/ML IJ SOLN
INTRAMUSCULAR | Status: DC | PRN
  Administered 2019-06-29: 5 mg via INTRAVENOUS

## 2019-06-29 MED ORDER — LACTATED RINGERS IV SOLN
INTRAVENOUS | Status: DC
  Administered 2019-06-29: 07:00:00 via INTRAVENOUS

## 2019-06-29 MED ORDER — OXYCODONE HCL 5 MG PO TABS
ORAL_TABLET | ORAL | Status: AC
  Filled 2019-06-29: qty 1

## 2019-06-29 MED ORDER — FENTANYL CITRATE (PF) 250 MCG/5ML IJ SOLN
INTRAMUSCULAR | Status: DC | PRN
  Administered 2019-06-29: 50 ug via INTRAVENOUS
  Administered 2019-06-29: 100 ug via INTRAVENOUS
  Administered 2019-06-29 (×2): 50 ug via INTRAVENOUS

## 2019-06-29 MED ORDER — KETOROLAC TROMETHAMINE 30 MG/ML IJ SOLN: 30.0000 mg | Freq: Once | INTRAMUSCULAR | Status: DC | PRN

## 2019-06-29 MED ORDER — MIDAZOLAM HCL 5 MG/5ML IJ SOLN
INTRAMUSCULAR | Status: DC | PRN
  Administered 2019-06-29: 2 mg via INTRAVENOUS

## 2019-06-29 SURGICAL SUPPLY — 37 items
CANISTER SUCT 3000ML PPV (MISCELLANEOUS) ×3 IMPLANT
COVER WAND RF STERILE (DRAPES) ×3 IMPLANT
DERMABOND ADVANCED (GAUZE/BANDAGES/DRESSINGS) ×2
DERMABOND ADVANCED .7 DNX12 (GAUZE/BANDAGES/DRESSINGS) ×1 IMPLANT
DRAPE CESAREAN BIRTH W POUCH (DRAPES) ×3 IMPLANT
DRAPE WARM FLUID 44X44 (DRAPES) ×3 IMPLANT
DRSG OPSITE POSTOP 4X10 (GAUZE/BANDAGES/DRESSINGS) ×3 IMPLANT
DURAPREP 26ML APPLICATOR (WOUND CARE) ×3 IMPLANT
GAUZE 4X4 16PLY RFD (DISPOSABLE) IMPLANT
GAUZE SPONGE 4X4 12PLY STRL LF (GAUZE/BANDAGES/DRESSINGS) ×3 IMPLANT
GLOVE BIO SURGEON STRL SZ 6.5 (GLOVE) ×2 IMPLANT
GLOVE BIO SURGEONS STRL SZ 6.5 (GLOVE) ×1
GLOVE BIOGEL PI IND STRL 7.0 (GLOVE) ×3 IMPLANT
GLOVE BIOGEL PI INDICATOR 7.0 (GLOVE) ×6
GOWN STRL REUS W/ TWL LRG LVL3 (GOWN DISPOSABLE) ×3 IMPLANT
GOWN STRL REUS W/TWL LRG LVL3 (GOWN DISPOSABLE) ×6
HIBICLENS CHG 4% 4OZ BTL (MISCELLANEOUS) ×3 IMPLANT
KIT TURNOVER KIT B (KITS) ×3 IMPLANT
NS IRRIG 1000ML POUR BTL (IV SOLUTION) ×3 IMPLANT
PACK ABDOMINAL GYN (CUSTOM PROCEDURE TRAY) ×3 IMPLANT
PAD ABD 7.5X8 STRL (GAUZE/BANDAGES/DRESSINGS) ×3 IMPLANT
PAD ARMBOARD 7.5X6 YLW CONV (MISCELLANEOUS) ×6 IMPLANT
PAD OB MATERNITY 4.3X12.25 (PERSONAL CARE ITEMS) ×3 IMPLANT
SPECIMEN JAR MEDIUM (MISCELLANEOUS) ×3 IMPLANT
SPONGE LAP 18X18 RF (DISPOSABLE) ×3 IMPLANT
SPONGE SURGIFOAM ABS GEL 12-7 (HEMOSTASIS) ×3 IMPLANT
SUT MNCRL 0 MO-4 VIOLET 18 CR (SUTURE) ×2 IMPLANT
SUT MNCRL 0 VIOLET 6X18 (SUTURE) ×1 IMPLANT
SUT MON AB-0 CT1 36 (SUTURE) ×3 IMPLANT
SUT MONOCRYL 0 6X18 (SUTURE) ×2
SUT MONOCRYL 0 MO 4 18  CR/8 (SUTURE) ×4
SUT PDS AB 0 CTX 60 (SUTURE) ×3 IMPLANT
SUT PLAIN 2 0 XLH (SUTURE) IMPLANT
SUT VIC AB 3-0 X1 27 (SUTURE) IMPLANT
SUT VIC AB 4-0 KS 27 (SUTURE) ×3 IMPLANT
TOWEL GREEN STERILE FF (TOWEL DISPOSABLE) ×6 IMPLANT
TRAY FOLEY W/BAG SLVR 14FR (SET/KITS/TRAYS/PACK) ×3 IMPLANT

## 2019-06-29 NOTE — Anesthesia Procedure Notes (Signed)
Procedure Name: Intubation Date/Time: 06/29/2019 7:41 AM Performed by: Amadeo Garnet, CRNA Pre-anesthesia Checklist: Patient identified, Emergency Drugs available, Suction available, Patient being monitored and Timeout performed Patient Re-evaluated:Patient Re-evaluated prior to induction Oxygen Delivery Method: Circle system utilized Preoxygenation: Pre-oxygenation with 100% oxygen Induction Type: IV induction Ventilation: Mask ventilation without difficulty Laryngoscope Size: 3 Grade View: Grade I Tube type: Oral Tube size: 7.0 mm Number of attempts: 1 Airway Equipment and Method: Stylet Secured at: 22 cm Tube secured with: Tape Dental Injury: Teeth and Oropharynx as per pre-operative assessment

## 2019-06-29 NOTE — Op Note (Signed)
Hysterectomy Procedure Note  Indications: fibroids  Pre-operative Diagnosis: fibroids  Post-operative Diagnosis: same  Operation: Total abdominal hysterectomy, bilateral salpingectomy  Surgeon: [name] , MD  Assistants: Dian Queen, MD  Anesthesia: General endotracheal anesthesia  Procedure Details  The patient was seen in the Holding Room. The risks, benefits, complications, treatment options, and expected outcomes were discussed with the patient.  The patient concurred with the proposed plan, giving informed consent.  The patient was taken to Operating Room # 8, identified as Cassandra Willis and the procedure verified as Total abdominal hysterectomy, bilateral salpingectomy. A Time Out was held and the above information confirmed.  After induction of anesthesia, the patient was draped and prepped in the usual sterile manner. Pt was placed in supine position after anesthesia and draped and prepped in the usual sterile manner. Foley catheter was placed.  A pfannenstiel incision was made and carried through the subcutaneous tissue to the fascia. Fascial incision was made and extended sharply. The rectus muscles were separated. The peritoneum was identified and entered. Peritoneal incision was extended longitudinally.  Self retaining retractor was placed and bowel was packed away from the surgical site.   The round ligaments were identified, cut, and ligated with 0-Vicryl. The anterior peritoneal reflection was incised and the bladder was dissected off the lower uterine segment Hemostasis  was observed. The uterine vessels were skeletonized, then clamped, cut and suture ligated with 0-Vicryl suture. Serial pedicles of the cardinal and utero-sacral ligaments were clamped, cut, and suture ligated with 0-Vicryl. Entrance was made into the vagina and the uterus removed. Vaginal cuff angle sutures were placed incorporating the utero-sacral ligaments for support. The vaginal cuff  was then closed with a running stitch of 0- Vicryl. Lavage was carried out until clear. Hemostasis was observed.  The right fallopian tube was grasped with a clamp and excised.  The pedicle was doubly suture ligated.  Hemostasis was noted and the procedure was repeated on the left fallopian tubes.    Retractor and all packing was removed from the abdomen. The fascia was approximated with running sutures of 0-PDS. Lavage was again carried out. Hemostasis was observed. The skin was approximated with vicryl.  Instrument, sponge, and needle counts were correct prior to abdominal closure and at the conclusion of the case.     Estimated Blood Loss: 50cc            Specimens: uterus and bilateral fallopian tubes                Complications:  None; patient tolerated the procedure well.         Disposition: PACU - hemodynamically stable.         Condition: stable  Attending Attestation: I was present and scrubbed for the entire procedure.

## 2019-06-29 NOTE — Anesthesia Postprocedure Evaluation (Signed)
Anesthesia Post Note  Patient: Cassandra Willis  Procedure(s) Performed: HYSTERECTOMY ABDOMINAL with salpingectomy (Bilateral Abdomen)     Patient location during evaluation: PACU Anesthesia Type: General Level of consciousness: sedated and patient cooperative Pain management: pain level controlled Vital Signs Assessment: post-procedure vital signs reviewed and stable Respiratory status: spontaneous breathing Cardiovascular status: stable Anesthetic complications: no    Last Vitals:  Vitals:   06/29/19 1000 06/29/19 1015  BP:  (!) 142/81  Pulse:  88  Resp:  18  Temp: 36.6 C 36.6 C  SpO2:  94%    Last Pain:  Vitals:   06/29/19 1015  TempSrc: Oral  PainSc:                  Nolon Nations

## 2019-06-29 NOTE — Progress Notes (Signed)
Day of Surgery Procedure(s) (LRB): HYSTERECTOMY ABDOMINAL with salpingectomy (Bilateral)  Subjective: Patient reports incisional pain - pain not controlled with IV dilaudid and toradol.  She is having mild nausea, no emesis.  No vb.  No CP/SOB  Objective: I have reviewed patient's vital signs, intake and output and medications.  Gen - NAD Abd - soft, appropriately tender  Assessment: s/p Procedure(s): HYSTERECTOMY ABDOMINAL with salpingectomy (Bilateral): stable  Plan: Advance diet start IV PCA  Advance to PO meds once she is able to tolerate PO diet Ambulate Continue foley until pain better controlled SCDs  LOS: 0 days    Cassandra Willis 06/29/2019, 1:05 PM

## 2019-06-29 NOTE — Transfer of Care (Signed)
Immediate Anesthesia Transfer of Care Note  Patient: Cassandra Willis  Procedure(s) Performed: HYSTERECTOMY ABDOMINAL with salpingectomy (Bilateral Abdomen)  Patient Location: PACU  Anesthesia Type:General  Level of Consciousness: awake, alert  and oriented  Airway & Oxygen Therapy: Patient Spontanous Breathing  Post-op Assessment: Report given to RN, Post -op Vital signs reviewed and stable and Patient moving all extremities  Post vital signs: Reviewed and stable  Last Vitals:  Vitals Value Taken Time  BP 146/77 06/29/19 0916  Temp    Pulse 91 06/29/19 0916  Resp 24 06/29/19 0916  SpO2 97 % 06/29/19 0916  Vitals shown include unvalidated device data.  Last Pain:  Vitals:   06/29/19 0642  PainSc: 0-No pain      Patients Stated Pain Goal: 3 ([id number])  Complications: No apparent anesthesia complications

## 2019-06-30 ENCOUNTER — Encounter (HOSPITAL_COMMUNITY): Payer: Self-pay | Admitting: Obstetrics and Gynecology

## 2019-06-30 LAB — CBC
HCT: 29.9 % — ABNORMAL LOW (ref 36.0–46.0)
Hemoglobin: 10.3 g/dL — ABNORMAL LOW (ref 12.0–15.0)
MCH: 31.5 pg (ref 26.0–34.0)
MCHC: 34.4 g/dL (ref 30.0–36.0)
MCV: 91.4 fL (ref 80.0–100.0)
Platelets: 213 10*3/uL (ref 150–400)
RBC: 3.27 MIL/uL — ABNORMAL LOW (ref 3.87–5.11)
RDW: 17.1 % — ABNORMAL HIGH (ref 11.5–15.5)
WBC: 10.6 10*3/uL — ABNORMAL HIGH (ref 4.0–10.5)
nRBC: 0 % (ref 0.0–0.2)

## 2019-06-30 LAB — SURGICAL PATHOLOGY

## 2019-06-30 MED ORDER — OXYCODONE-ACETAMINOPHEN 5-325 MG PO TABS: 1.0000 | ORAL_TABLET | ORAL | 0 refills | Status: DC | PRN

## 2019-06-30 MED ORDER — IBUPROFEN 600 MG PO TABS: 600.0000 mg | ORAL_TABLET | Freq: Four times a day (QID) | ORAL | 2 refills | Status: AC | PRN

## 2019-06-30 NOTE — Discharge Summary (Signed)
Physician Discharge Summary  Patient ID: Cassandra Willis MRN: [id number] DOB/AGE: [birth year] 53 y.o.  Admit date: 06/29/2019 Discharge date: 06/30/2019  Admission Diagnoses: fibroids  Discharge Diagnoses:  Active Problems:   Fibroids   Discharged Condition: good  Hospital Course: pt was admitted for postop care.  Pain was initially controlled with IV pain medications.  Once she was able to tolerate a PO diet, her pain was controlled with oral medications.  Vitals, labs and UOP were stable throughout her hospital stay.  On POD1, she was requesting discharge.  She was tolerating po and ambulating.  She was able to void without her foley catheter.    Consults: None  Significant Diagnostic Studies: labs: cbc  Treatments: IV hydration and surgery: TAH/BS  Discharge Exam: Blood pressure 123/77, pulse 79, temperature 98.2 F (36.8 C), temperature source Oral, resp. rate 17, height 5\' 1"  (1.549 m), weight 95.6 kg, SpO2 98 %. General appearance: alert and cooperative GI: normal findings: soft, non-tender Incision/Wound:c/d/i  Disposition: Discharge disposition: 01-Home or Self Care        Allergies as of 06/30/2019   No Known Allergies     Medication List    STOP taking these medications   acetaminophen 500 MG tablet Commonly known as: TYLENOL   meloxicam 7.5 MG tablet Commonly known as: Mobic     TAKE these medications   ibuprofen 600 MG tablet Commonly known as: Advil Take 1 tablet (600 mg total) by mouth every 6 (six) hours as needed.   oxyCODONE-acetaminophen 5-325 MG tablet Commonly known as: PERCOCET/ROXICET Take 1-2 tablets by mouth every 4 (four) hours as needed for moderate pain.      Follow-up Information    [name], MD. Schedule an appointment as soon as possible for a visit in 2 week(s).   Specialty: Obstetrics and Gynecology Contact information: [place], [place] [address] [phone]            Signed: [name] 06/30/2019, 7:51 AM

## 2019-06-30 NOTE — Progress Notes (Signed)
11mL of Morphine wasted with Ludwig Clarks, RN.

## 2019-06-30 NOTE — Plan of Care (Signed)
  Problem: Education: Goal: Knowledge of General Education information will improve Description Including pain rating scale, medication(s)/side effects and non-pharmacologic comfort measures Outcome: Progressing   

## 2019-07-28 ENCOUNTER — Encounter: Payer: Self-pay | Admitting: Internal Medicine

## 2020-10-18 ENCOUNTER — Other Ambulatory Visit: Payer: Self-pay

## 2020-10-18 ENCOUNTER — Encounter (HOSPITAL_COMMUNITY): Payer: Self-pay

## 2020-10-18 ENCOUNTER — Emergency Department (HOSPITAL_COMMUNITY)
Admission: EM | Admit: 2020-10-18 | Discharge: 2020-10-18 | Disposition: A | Discharge status: Home / Self Care | Attending: Emergency Medicine | Admitting: Emergency Medicine

## 2020-10-18 DIAGNOSIS — R1031 Right lower quadrant pain: Secondary | ICD-10-CM | POA: Insufficient documentation

## 2020-10-18 DIAGNOSIS — Z5321 Procedure and treatment not carried out due to patient leaving prior to being seen by health care provider: Secondary | ICD-10-CM | POA: Insufficient documentation

## 2020-10-18 LAB — LIPASE, BLOOD: Lipase: 32 U/L (ref 11–51)

## 2020-10-18 LAB — URINALYSIS, ROUTINE W REFLEX MICROSCOPIC
Bilirubin Urine: NEGATIVE
Glucose, UA: NEGATIVE mg/dL
Hgb urine dipstick: NEGATIVE
Ketones, ur: NEGATIVE mg/dL
Leukocytes,Ua: NEGATIVE
Nitrite: NEGATIVE
Protein, ur: NEGATIVE mg/dL
Specific Gravity, Urine: 1.02 (ref 1.005–1.030)
pH: 5 (ref 5.0–8.0)

## 2020-10-18 LAB — COMPREHENSIVE METABOLIC PANEL
ALT: 32 U/L (ref 0–44)
AST: 25 U/L (ref 15–41)
Albumin: 4 g/dL (ref 3.5–5.0)
Alkaline Phosphatase: 86 U/L (ref 38–126)
Anion gap: 9 (ref 5–15)
BUN: 9 mg/dL (ref 6–20)
CO2: 25 mmol/L (ref 22–32)
Calcium: 9.4 mg/dL (ref 8.9–10.3)
Chloride: 107 mmol/L (ref 98–111)
Creatinine, Ser: 0.84 mg/dL (ref 0.44–1.00)
GFR, Estimated: >60 mL/min (ref >60)
Glucose, Bld: 107 mg/dL — ABNORMAL HIGH (ref 70–99)
Potassium: 3.2 mmol/L — ABNORMAL LOW (ref 3.5–5.1)
Sodium: 141 mmol/L (ref 135–145)
Total Bilirubin: 0.4 mg/dL (ref 0.3–1.2)
Total Protein: 7.4 g/dL (ref 6.5–8.1)

## 2020-10-18 LAB — CBC
HCT: 40.8 % (ref 36.0–46.0)
Hemoglobin: 13.1 g/dL (ref 12.0–15.0)
MCH: 28.7 pg (ref 26.0–34.0)
MCHC: 32.1 g/dL (ref 30.0–36.0)
MCV: 89.3 fL (ref 80.0–100.0)
Platelets: 375 10*3/uL (ref 150–400)
RBC: 4.57 MIL/uL (ref 3.87–5.11)
RDW: 13.5 % (ref 11.5–15.5)
WBC: 6.5 10*3/uL (ref 4.0–10.5)
nRBC: 0 % (ref 0.0–0.2)

## 2020-10-18 NOTE — ED Triage Notes (Signed)
Patient complains of recurrent/ongoing pain to right lower quadrant radiating to right flank, patient states this is different from her disc problems in her back. No associated symptoms

## 2020-10-18 NOTE — ED Notes (Signed)
Pt did not to stay and said she was leaving.

## 2020-10-19 ENCOUNTER — Emergency Department (HOSPITAL_COMMUNITY): Payer: Self-pay

## 2020-10-19 ENCOUNTER — Emergency Department (HOSPITAL_COMMUNITY)
Admission: EM | Admit: 2020-10-19 | Discharge: 2020-10-19 | Disposition: A | Discharge status: Home / Self Care | Payer: Self-pay | Attending: Emergency Medicine | Admitting: Emergency Medicine

## 2020-10-19 ENCOUNTER — Encounter (HOSPITAL_COMMUNITY): Payer: Self-pay | Admitting: Emergency Medicine

## 2020-10-19 DIAGNOSIS — R1031 Right lower quadrant pain: Secondary | ICD-10-CM

## 2020-10-19 DIAGNOSIS — R911 Solitary pulmonary nodule: Secondary | ICD-10-CM | POA: Insufficient documentation

## 2020-10-19 DIAGNOSIS — R918 Other nonspecific abnormal finding of lung field: Secondary | ICD-10-CM

## 2020-10-19 LAB — CBC WITH DIFFERENTIAL/PLATELET
Abs Immature Granulocytes: 0.03 10*3/uL (ref 0.00–0.07)
Basophils Absolute: 0 10*3/uL (ref 0.0–0.1)
Basophils Relative: 0 %
Eosinophils Absolute: 0.1 10*3/uL (ref 0.0–0.5)
Eosinophils Relative: 2 %
HCT: 41.4 % (ref 36.0–46.0)
Hemoglobin: 13.4 g/dL (ref 12.0–15.0)
Immature Granulocytes: 0 %
Lymphocytes Relative: 30 %
Lymphs Abs: 2 10*3/uL (ref 0.7–4.0)
MCH: 29 pg (ref 26.0–34.0)
MCHC: 32.4 g/dL (ref 30.0–36.0)
MCV: 89.6 fL (ref 80.0–100.0)
Monocytes Absolute: 0.7 10*3/uL (ref 0.1–1.0)
Monocytes Relative: 11 %
Neutro Abs: 3.9 10*3/uL (ref 1.7–7.7)
Neutrophils Relative %: 57 %
Platelets: 353 10*3/uL (ref 150–400)
RBC: 4.62 MIL/uL (ref 3.87–5.11)
RDW: 13.4 % (ref 11.5–15.5)
WBC: 6.7 10*3/uL (ref 4.0–10.5)
nRBC: 0 % (ref 0.0–0.2)

## 2020-10-19 LAB — COMPREHENSIVE METABOLIC PANEL
ALT: 37 U/L (ref 0–44)
AST: 30 U/L (ref 15–41)
Albumin: 4 g/dL (ref 3.5–5.0)
Alkaline Phosphatase: 91 U/L (ref 38–126)
Anion gap: 11 (ref 5–15)
BUN: 7 mg/dL (ref 6–20)
CO2: 24 mmol/L (ref 22–32)
Calcium: 9.2 mg/dL (ref 8.9–10.3)
Chloride: 107 mmol/L (ref 98–111)
Creatinine, Ser: 0.9 mg/dL (ref 0.44–1.00)
GFR, Estimated: >60 mL/min (ref >60)
Glucose, Bld: 102 mg/dL — ABNORMAL HIGH (ref 70–99)
Potassium: 3.6 mmol/L (ref 3.5–5.1)
Sodium: 142 mmol/L (ref 135–145)
Total Bilirubin: 0.6 mg/dL (ref 0.3–1.2)
Total Protein: 7.6 g/dL (ref 6.5–8.1)

## 2020-10-19 LAB — URINALYSIS, ROUTINE W REFLEX MICROSCOPIC
Bilirubin Urine: NEGATIVE
Glucose, UA: NEGATIVE mg/dL
Hgb urine dipstick: NEGATIVE
Ketones, ur: NEGATIVE mg/dL
Leukocytes,Ua: NEGATIVE
Nitrite: NEGATIVE
Protein, ur: NEGATIVE mg/dL
Specific Gravity, Urine: 1.018 (ref 1.005–1.030)
pH: 5 (ref 5.0–8.0)

## 2020-10-19 MED ORDER — IOHEXOL 300 MG/ML  SOLN
100.0000 mL | Freq: Once | INTRAMUSCULAR | Status: AC | PRN
  Administered 2020-10-19: 100 mL via INTRAVENOUS

## 2020-10-19 NOTE — ED Provider Notes (Addendum)
[name] Provider Note   CSN: [id number] Arrival date & time: 10/19/20  0936     History Chief Complaint  Patient presents with  . Flank Pain    Cassandra Willis is a 55 y.o. female.  HPI Patient is a 55 year old female who presents the emergency department due to abdominal pain.  Patient states she has been experiencing intermittent right flank pain for the past few years.  Recently she felt that her symptoms began worsening and radiating around the right torso and to the right lower quadrant.  She has no other complaints at this time.  No fevers, chills, URI symptoms, chest pain, shortness of breath, nausea, vomiting, diarrhea, urinary complaints, vaginal discharge.  No pelvic pain.  Status post hysterectomy.    Past Medical History:  Diagnosis Date  . Medical history non-contributory     Patient Active Problem List   Diagnosis Date Noted  . Fibroids 06/22/2019  . Amenorrhea 07/09/2012    Past Surgical History:  Procedure Laterality Date  . ABDOMINAL HYSTERECTOMY  06/29/2019  . ABDOMINAL HYSTERECTOMY Bilateral 06/29/2019   Procedure: HYSTERECTOMY ABDOMINAL with salpingectomy;  Surgeon: [name], MD;  Location: [facility];  Service: Gynecology;  Laterality: Bilateral;  . FOOT SURGERY  1998  . TUBAL LIGATION  1999  . TUBAL LIGATION       OB History   No obstetric history on file.     Family History  Problem Relation Age of Onset  . Hypertension Mother     Social History   Tobacco Use  . Smoking status: Never Smoker  . Smokeless tobacco: Never Used  Vaping Use  . Vaping Use: Never used  Substance Use Topics  . Alcohol use: No  . Drug use: No    Home Medications Prior to Admission medications   Medication Sig Start Date End Date Taking? Authorizing Provider  ibuprofen (ADVIL) 600 MG tablet Take 1 tablet (600 mg total) by mouth every 6 (six) hours as needed. 06/30/19   [name], MD   oxyCODONE-acetaminophen (PERCOCET/ROXICET) 5-325 MG tablet Take 1-2 tablets by mouth every 4 (four) hours as needed for moderate pain. 06/30/19   [name], MD    Allergies    Patient has no known allergies.  Review of Systems   Review of Systems  All other systems reviewed and are negative. Ten systems reviewed and are negative for acute change, except as noted in the HPI.   Physical Exam Updated Vital Signs BP 139/89 (BP Location: Right Arm)   Pulse 84   Temp 98.7 F (37.1 C) (Oral)   Resp 16   Ht 5\' 1"  (1.549 m)   Wt 99.8 kg   LMP 10/25/2014   SpO2 99%   BMI 41.57 kg/m   Physical Exam Vitals and nursing note reviewed.  Constitutional:      General: She is not in acute distress.    Appearance: Normal appearance. She is not ill-appearing, toxic-appearing or diaphoretic.  HENT:     Head: Normocephalic and atraumatic.     Right Ear: External ear normal.     Left Ear: External ear normal.     Nose: Nose normal.     Mouth/Throat:     Mouth: Mucous membranes are moist.     Pharynx: Oropharynx is clear. No oropharyngeal exudate or posterior oropharyngeal erythema.  Eyes:     Extraocular Movements: Extraocular movements intact.  Cardiovascular:     Rate and Rhythm: Normal rate and regular rhythm.  Pulses: Normal pulses.     Heart sounds: Normal heart sounds. No murmur heard. No friction rub. No gallop.   Pulmonary:     Effort: Pulmonary effort is normal. No respiratory distress.     Breath sounds: Normal breath sounds. No stridor. No wheezing, rhonchi or rales.  Abdominal:     General: Abdomen is flat.     Palpations: Abdomen is soft.     Tenderness: There is abdominal tenderness. There is no right CVA tenderness or left CVA tenderness.     Comments: Protuberant abdomen that is soft.  Mild tenderness noted in the right lower quadrant with deep palpation.  Musculoskeletal:        General: Normal range of motion.     Cervical back: Normal range of motion and  neck supple. No tenderness.  Skin:    General: Skin is warm and dry.  Neurological:     General: No focal deficit present.     Mental Status: She is alert and oriented to person, place, and time.  Psychiatric:        Mood and Affect: Mood normal.        Behavior: Behavior normal.    ED Results / Procedures / Treatments   Labs (all labs ordered are listed, but only abnormal results are displayed) Labs Reviewed  COMPREHENSIVE METABOLIC PANEL - Abnormal; Notable for the following components:      Result Value   Glucose, Bld 102 (*)    All other components within normal limits  CBC WITH DIFFERENTIAL/PLATELET  URINALYSIS, ROUTINE W REFLEX MICROSCOPIC   EKG None  Radiology CT ABDOMEN PELVIS W CONTRAST  Result Date: 10/19/2020 CLINICAL DATA:  Worsening right lower quadrant pain over the past few months. No nausea vomiting or diarrhea EXAM: CT ABDOMEN AND PELVIS WITH CONTRAST TECHNIQUE: Multidetector CT imaging of the abdomen and pelvis was performed using the standard protocol following bolus administration of intravenous contrast. CONTRAST:  163mL OMNIPAQUE IOHEXOL 300 MG/ML  SOLN COMPARISON:  None. FINDINGS: Lower chest: Few scattered small pulmonary nodules including a 5 mm ground-glass nodule in the left lower lobe (series 5, image 3), a 3 mm right lower lobe pulmonary nodule (series 5, image 11), and a 2 mm left lower lobe nodule (series 5, image 8. Normal size heart. No pericardial effusion. Hepatobiliary: Scattered bilobar hepatic hypodensities for instance there is a 9 mm hypodensity in the left lobe of the liver 16/3, a 4 mm hypodensity along the lateral aspect of the right lobe of the liver 17/3, and a 7 mm hypodensity along the inferior medial aspect of the right lobe of the liver 29/3, technically these are too small to accurately characterize however in the absence of known malignancy these are favored represent hepatic cysts. Pharyngeal cap of the gallbladder. Otherwise gallbladder  is unremarkable. No biliary ductal dilatation. Pancreas: Unremarkable Spleen: Unremarkable Adrenals/Urinary Tract: Adrenal glands are unremarkable. No hydronephrosis. No suspicious renal lesions. No nephrolithiasis. Bladder is predominantly decompressed but otherwise unremarkable. Stomach/Bowel: Normal decompressed stomach. No evidence abnormal small bowel wall thickening or dilation. Normal appendix. Terminal ileum appears normal. Diffuse colonic diverticulosis without findings of diverticulitis. Descending and sigmoid colon are predominantly decompressed limiting evaluation of the wall. Vascular/Lymphatic: Aortic atherosclerosis. No enlarged abdominal or pelvic lymph nodes. Reproductive: Status post hysterectomy. No adnexal masses. Other: No abdominopelvic ascites. Musculoskeletal: No acute or significant osseous findings. IMPRESSION: 1. No acute findings in the abdomen or pelvis. Normal appendix. 2. Colonic diverticulosis without findings of acute diverticulitis. 3. Scattered bilobar  hepatic hypodensities, technically these are too small to accurately characterize however in the absence of known malignancy these are favored represent hepatic cysts. 4. Few scattered small pulmonary nodules in the lung bases measuring up to 5 mm. No follow-up needed if patient is low-risk (and has no known or suspected primary neoplasm). Non-contrast chest CT can be considered in 12 months if patient is high-risk. This recommendation follows the consensus statement: Guidelines for Management of Incidental Pulmonary Nodules Detected on CT Images: From the Fleischner Society 2017; Radiology 2017; 284:228-243. Aortic Atherosclerosis (ICD10-I70.0). Electronically Signed   By: Dahlia Bailiff MD   On: 10/19/2020 13:01    Procedures Procedures   Medications Ordered in ED Medications  iohexol (OMNIPAQUE) 300 MG/ML solution 100 mL (100 mLs Intravenous Contrast Given 10/19/20 1248)    ED Course  I have reviewed the triage vital  signs and the nursing notes.  Pertinent labs & imaging results that were available during my care of the patient were reviewed by me and considered in my medical decision making (see chart for details).    MDM Rules/Calculators/A&P                          Pt is a 55 y.o. female who presents the emergency department due to right lower quadrant abdominal pain.  Patient left without being seen yesterday due to the wait times.  Basic labs were obtained yesterday.  Findings as noted below.  Labs: CBC without abnormalities. CMP with a glucose of 102, otherwise no abnormalities.  Normal kidney function. UA without abnormalities.  Imaging: CT scan of the abdomen and pelvis was obtained with IV contrast.  No acute findings were noted in the abdomen or pelvis.  There is a normal appendix.  Diverticulosis without diverticulitis.  Scattered by lobar hepatic hypodensities which are too small to accurately characterize.  Feel that these are likely hepatic cysts.  Is a few scattered pulmonary nodules as well in the lung bases measuring up to 5 mm.  If low risk no follow-up needed.  Otherwise, consider noncontrast CT of the chest in 12 months.  I, Rayna Sexton, PA-C, personally reviewed and evaluated these images and lab results as part of my medical decision-making.  Unsure of the cause of the patient's right flank and abdominal pain.  Likely musculoskeletal.  Reassuring CT scan.  Did discuss findings noted above regarding hepatic cyst as well as her pulmonary nodules.  Patient is going to follow-up with her PCP.  She is not a smoker but does note that her husband is and she has significant secondhand exposure.  Also recommended that she work with her PCP to schedule a colonoscopy given her family history of colon cancer.  Discussed return precautions.  Her questions were answered and she was amicable at the time of discharge.  Note: Portions of this report may have been transcribed using voice  recognition software. Every effort was made to ensure accuracy; however, inadvertent computerized transcription errors may be present.   Final Clinical Impression(s) / ED Diagnoses Final diagnoses:  RLQ abdominal pain  Pulmonary nodules    Rx / DC Orders ED Discharge Orders    None       Rayna Sexton, PA-C 10/19/20 1344    Rayna Sexton, PA-C 10/19/20 1350    Charlesetta Shanks, MD 10/24/20 [phone]

## 2020-10-19 NOTE — ED Notes (Signed)
She had labs and urine last night and results in chart.

## 2020-10-19 NOTE — Discharge Instructions (Addendum)
Like we discussed, you need to make sure that you find a new primary care provider in the area and schedule a follow-up appointment.  Also, we found both cysts on your liver today as well as pulmonary nodules.  These are very common findings when obtaining CT scans but it might be reasonable to have a follow-up CT scan of your chest in 1 year to make sure that these are not growing in size.  Also, consider working with your new primary doctor to schedule a colonoscopy, given your family history of colon cancer.  If you have new or worsening symptoms, you can always return to the emergency department.  It was a pleasure to meet you.

## 2020-10-19 NOTE — ED Notes (Signed)
Patient verbalizes understanding of discharge instructions. Opportunity for questioning and answers were provided. Armband removed by staff, pt discharged from ED.  

## 2020-10-19 NOTE — ED Notes (Signed)
Pt ambulating to bathroom without difficulty 

## 2020-10-19 NOTE — ED Notes (Signed)
Patient transported to CT 

## 2020-10-19 NOTE — ED Triage Notes (Signed)
Pt returns to ED after not being seen by a provider yesterday due to long wait times, She has been ongoing right flank pain radiating into RLQ pain for 3 months. She is concerned because of a family history of colon cancer.

## 2020-10-25 ENCOUNTER — Encounter (HOSPITAL_COMMUNITY): Payer: Self-pay

## 2020-10-25 ENCOUNTER — Emergency Department (HOSPITAL_COMMUNITY)
Admission: EM | Admit: 2020-10-25 | Discharge: 2020-10-25 | Disposition: A | Discharge status: Home / Self Care | Payer: Self-pay | Attending: Physician Assistant | Admitting: Physician Assistant

## 2020-10-25 DIAGNOSIS — L509 Urticaria, unspecified: Secondary | ICD-10-CM | POA: Insufficient documentation

## 2020-10-25 DIAGNOSIS — Z5321 Procedure and treatment not carried out due to patient leaving prior to being seen by health care provider: Secondary | ICD-10-CM | POA: Insufficient documentation

## 2020-10-25 MED ORDER — DIPHENHYDRAMINE HCL 25 MG PO CAPS
25.0000 mg | ORAL_CAPSULE | Freq: Once | ORAL | Status: AC
  Administered 2020-10-25: 25 mg via ORAL
  Filled 2020-10-25: qty 1

## 2020-10-25 NOTE — ED Notes (Signed)
Left on own accord.

## 2020-10-25 NOTE — ED Triage Notes (Signed)
Pt reports that she woke up this morning with hives, took some benadryl with relief, hives have continued to return throughout the day. Last took benadryl at 6pm. Denies new soaps/ foods

## 2021-07-27 DIAGNOSIS — R739 Hyperglycemia, unspecified: Secondary | ICD-10-CM | POA: Diagnosis not present

## 2021-07-27 DIAGNOSIS — Z13220 Encounter for screening for lipoid disorders: Secondary | ICD-10-CM | POA: Diagnosis not present

## 2021-07-27 DIAGNOSIS — Z0001 Encounter for general adult medical examination with abnormal findings: Secondary | ICD-10-CM | POA: Diagnosis not present

## 2021-08-22 DIAGNOSIS — L292 Pruritus vulvae: Secondary | ICD-10-CM | POA: Diagnosis not present

## 2021-08-22 DIAGNOSIS — Z6841 Body Mass Index (BMI) 40.0 and over, adult: Secondary | ICD-10-CM | POA: Diagnosis not present

## 2021-08-22 DIAGNOSIS — Z01419 Encounter for gynecological examination (general) (routine) without abnormal findings: Secondary | ICD-10-CM | POA: Diagnosis not present

## 2021-08-22 DIAGNOSIS — Z1211 Encounter for screening for malignant neoplasm of colon: Secondary | ICD-10-CM | POA: Diagnosis not present

## 2021-09-18 ENCOUNTER — Other Ambulatory Visit: Payer: Self-pay | Admitting: Nurse Practitioner

## 2021-09-18 DIAGNOSIS — N6320 Unspecified lump in the left breast, unspecified quadrant: Secondary | ICD-10-CM

## 2021-09-18 DIAGNOSIS — L739 Follicular disorder, unspecified: Secondary | ICD-10-CM | POA: Diagnosis not present

## 2021-09-24 ENCOUNTER — Other Ambulatory Visit: Payer: Self-pay

## 2021-10-12 DIAGNOSIS — E7800 Pure hypercholesterolemia, unspecified: Secondary | ICD-10-CM | POA: Diagnosis not present

## 2021-10-18 ENCOUNTER — Other Ambulatory Visit: Payer: Self-pay

## 2021-10-19 ENCOUNTER — Ambulatory Visit
Admission: RE | Admit: 2021-10-19 | Discharge: 2021-10-19 | Disposition: A | Discharge status: Home / Self Care | Payer: Self-pay | Source: Ambulatory Visit | Attending: Nurse Practitioner | Admitting: Nurse Practitioner

## 2021-10-19 ENCOUNTER — Ambulatory Visit
Admission: RE | Admit: 2021-10-19 | Discharge: 2021-10-19 | Disposition: A | Discharge status: Home / Self Care | Payer: BC Managed Care – PPO | Source: Ambulatory Visit | Attending: Nurse Practitioner | Admitting: Nurse Practitioner

## 2021-10-19 DIAGNOSIS — N6320 Unspecified lump in the left breast, unspecified quadrant: Secondary | ICD-10-CM

## 2021-10-19 DIAGNOSIS — R928 Other abnormal and inconclusive findings on diagnostic imaging of breast: Secondary | ICD-10-CM | POA: Diagnosis not present

## 2021-10-19 DIAGNOSIS — N6002 Solitary cyst of left breast: Secondary | ICD-10-CM | POA: Diagnosis not present

## 2022-10-05 IMAGING — MG DIGITAL DIAGNOSTIC BILAT W/ TOMO W/ CAD
6 of 10 series · 6 of 30 positions shown · non-contrast
Comparison: Previous exam(s).

ACR Breast Density Category a: The breast tissue is almost entirely
fatty.

CLINICAL DATA: 55-year-old female with INNER LEFT breast skin
redness/inflammation after squeezing a skin lesion, significantly
decreased following antibiotics.

EXAM:
DIGITAL DIAGNOSTIC BILATERAL MAMMOGRAM WITH TOMOSYNTHESIS AND CAD;
ULTRASOUND LEFT BREAST LIMITED
TECHNIQUE: Bilateral digital diagnostic mammography and breast tomosynthesis
was performed. The images were evaluated with computer-aided
detection.; Targeted ultrasound examination of the left breast was
performed.

[L CC synth-2D (1 of 2)]
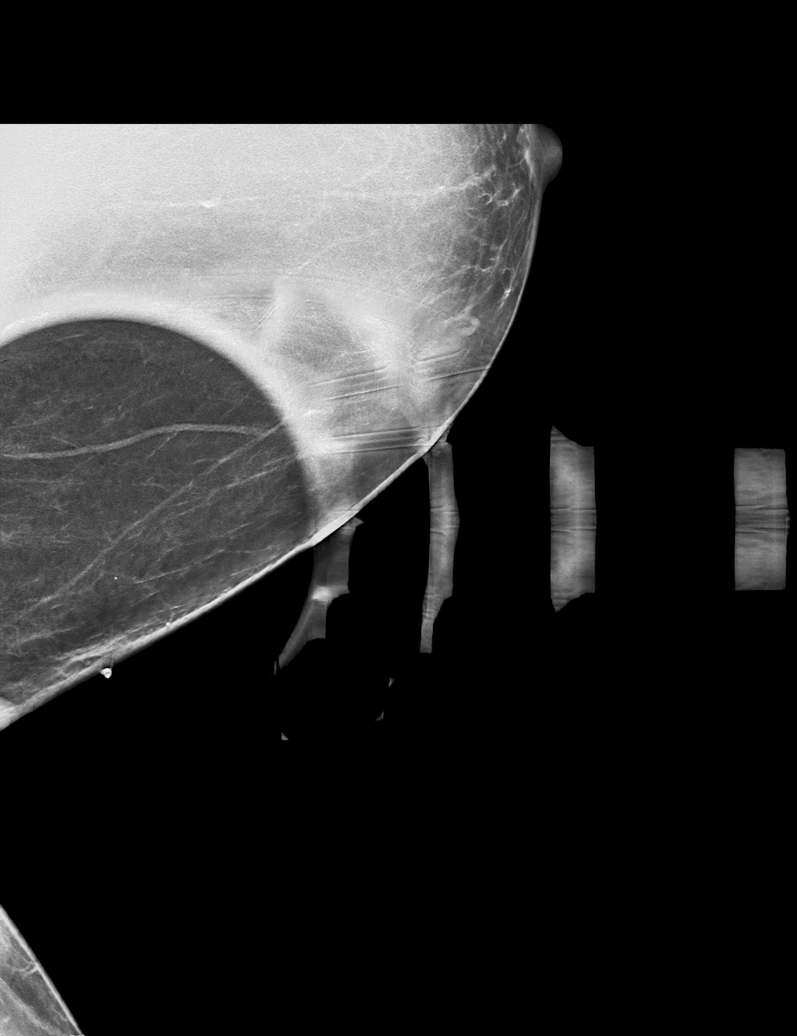

[L CC synth-2D (2 of 2)]
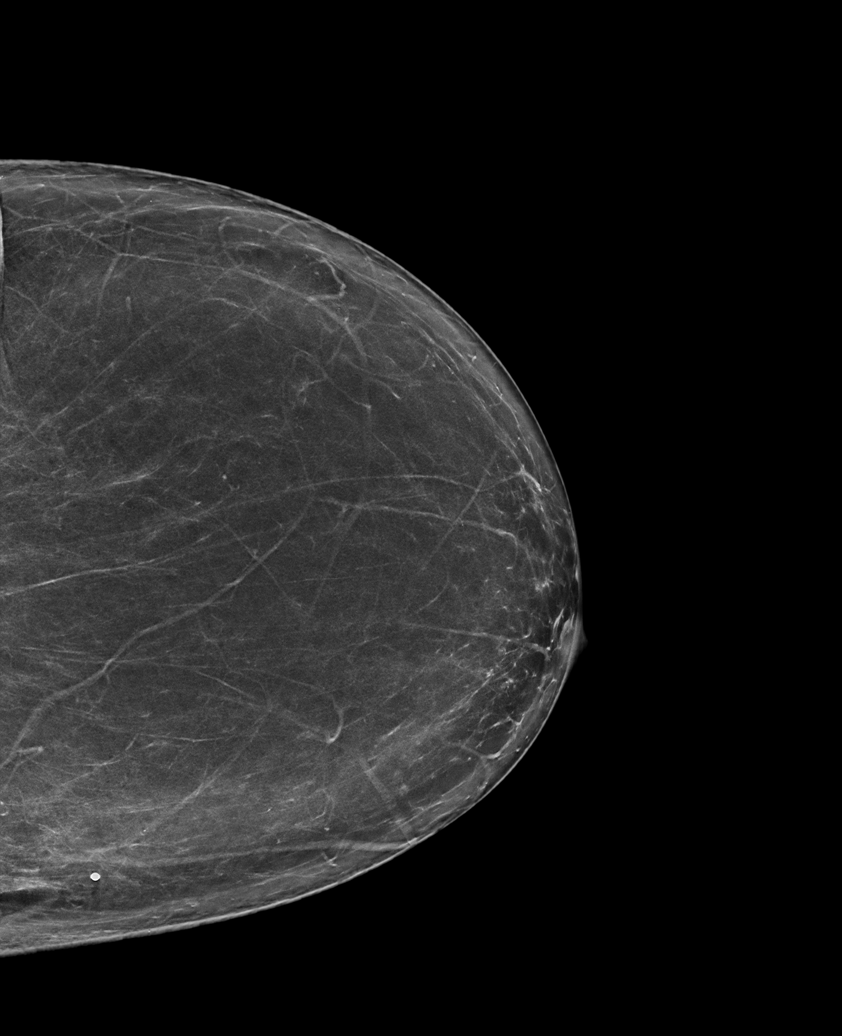

[R MLO synth-2D]
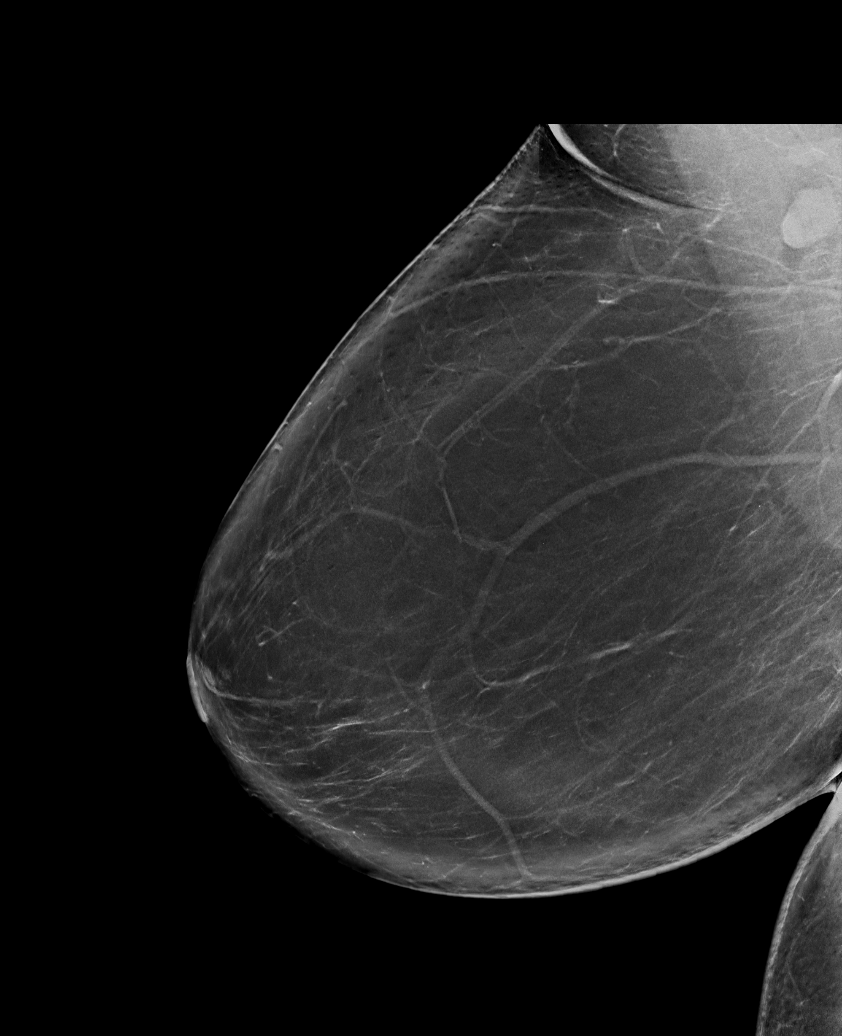

[L MLO synth-2D]
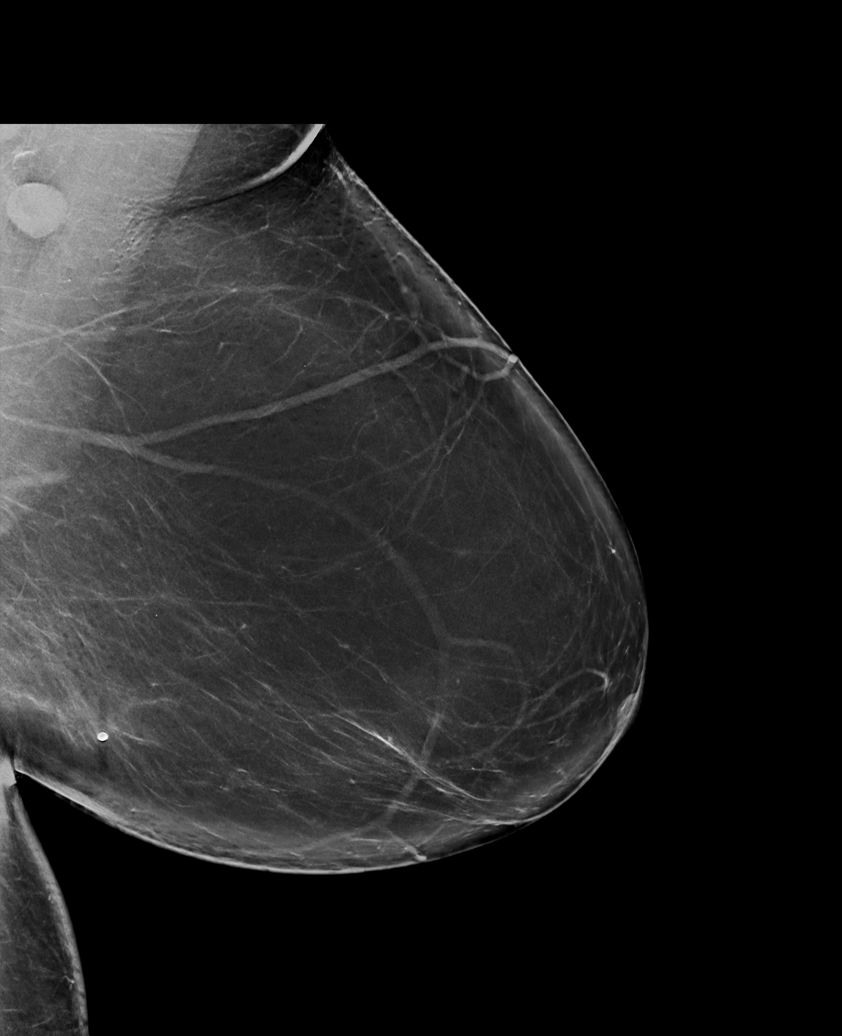

[R CC synth-2D]
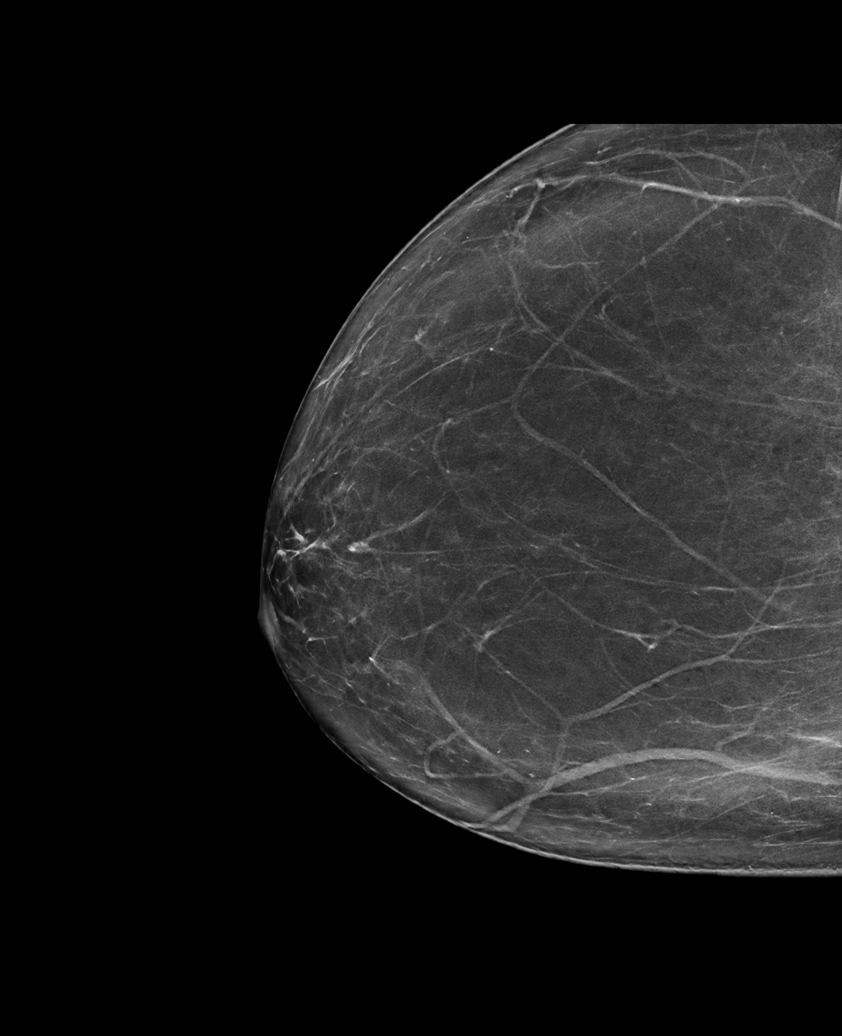

[L CC tomo · tomo slice 39/78.0]
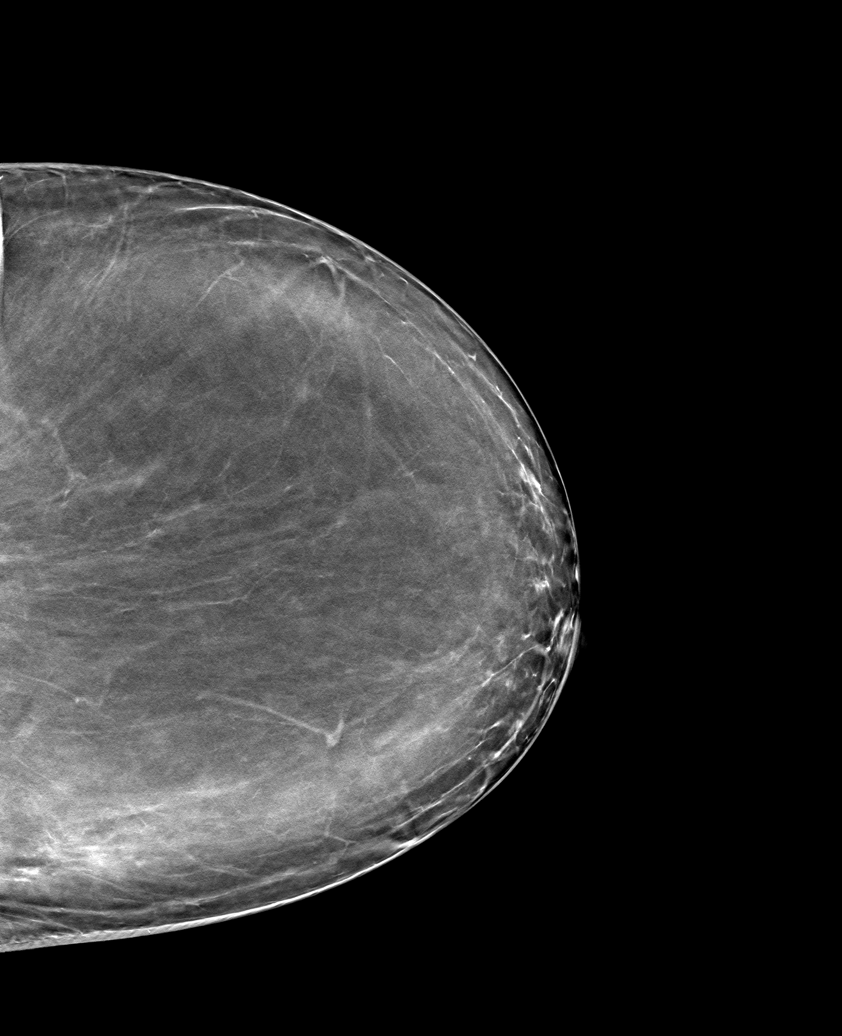

[6 of 30 positions shown; findings below may reference images not displayed]

FINDINGS: 2D/3D full field views of both breasts and spot compression view of
the LEFT breast demonstrate minimal asymmetry within the INNER LEFT
breast.

On physical exam, mild skin darkening at the [DATE] position LEFT
breast 10 cm from the nipple is noted.

Targeted ultrasound is performed, showing a 0.8 x 0.2 x 0.7 cm
collection within the skin at the [DATE] position of the LEFT breast
10 cm from the nipple, compatible with a sebaceous cyst.
IMPRESSION: 1. 0.8 cm sebaceous cyst within the INNER LEFT breast.
2. No suspicious mammographic abnormalities.

RECOMMENDATION:
Bilateral screening mammogram in 1 year.

I have discussed the findings and recommendations with the patient.
If applicable, a reminder letter will be sent to the patient
regarding the next appointment.

BI-RADS CATEGORY  2: Benign.

## 2023-01-28 ENCOUNTER — Encounter: Payer: Self-pay | Admitting: Family

## 2023-02-19 ENCOUNTER — Ambulatory Visit (AMBULATORY_SURGERY_CENTER): Payer: 59

## 2023-02-19 VITALS — Ht 61.0 in | Wt 242.0 lb

## 2023-02-19 DIAGNOSIS — Z1211 Encounter for screening for malignant neoplasm of colon: Secondary | ICD-10-CM

## 2023-02-19 HISTORY — DX: Morbid (severe) obesity due to excess calories: E66.01

## 2023-02-19 MED ORDER — NA SULFATE-K SULFATE-MG SULF 17.5-3.13-1.6 GM/177ML PO SOLN: 1.0000 | Freq: Once | ORAL | 0 refills | Status: AC

## 2023-02-19 NOTE — Progress Notes (Signed)
No egg or soy allergy known to patient  No issues known to pt with past sedation with any surgeries or procedures Patient denies ever being told they had issues or difficulty with intubation  No FH of Malignant Hyperthermia Pt is not on diet pills Pt is not on  home 02  Pt is not on blood thinners  Pt denies issues with constipation  No A fib or A flutter Have any cardiac testing pending--no  Pt is ambulatory   PV completed. Prep instructions reviewed and sent to address on file. Good rx coupon for [facility] provided.   Pt instructed to use Singlecare.com or GoodRx for a price reduction on prep

## 2023-03-04 ENCOUNTER — Encounter: Payer: Self-pay | Admitting: Gastroenterology

## 2023-03-14 ENCOUNTER — Encounter: Payer: Self-pay | Admitting: Certified Registered Nurse Anesthetist

## 2023-03-24 ENCOUNTER — Ambulatory Visit: Payer: 59 | Admitting: Gastroenterology

## 2023-03-24 ENCOUNTER — Encounter: Payer: Self-pay | Admitting: Gastroenterology

## 2023-03-24 VITALS — BP 145/76 | HR 78 | Temp 98.2°F | Resp 17 | Ht 61.0 in | Wt 242.0 lb

## 2023-03-24 DIAGNOSIS — D125 Benign neoplasm of sigmoid colon: Secondary | ICD-10-CM | POA: Diagnosis not present

## 2023-03-24 DIAGNOSIS — K635 Polyp of colon: Secondary | ICD-10-CM | POA: Diagnosis not present

## 2023-03-24 DIAGNOSIS — D123 Benign neoplasm of transverse colon: Secondary | ICD-10-CM

## 2023-03-24 DIAGNOSIS — Z1211 Encounter for screening for malignant neoplasm of colon: Secondary | ICD-10-CM | POA: Diagnosis not present

## 2023-03-24 MED ORDER — SODIUM CHLORIDE 0.9 % IV SOLN: 500.0000 mL | INTRAVENOUS | Status: AC

## 2023-03-24 NOTE — Progress Notes (Signed)
[facility] History and Physical   Primary Care Physician:  No primary care provider on file.   Reason for Procedure:   Colon cancer screening  Plan:    colonoscopy     HPI: Cassandra Willis is a 57 y.o. female  here for colonoscopy screening - first time exam.   Patient denies any bowel symptoms at this time. Grandmother had colon cancer no first degree relatives with colon cancer. Otherwise feels well without any cardiopulmonary symptoms.   I have discussed risks / benefits of anesthesia and endoscopic procedure with Jerry Caras and they wish to proceed with the exams as outlined today.    Past Medical History:  Diagnosis Date   Medical history non-contributory    Morbid (severe) obesity due to excess calories (HCC) 02/19/2023   bmi 45.73    Past Surgical History:  Procedure Laterality Date   ABDOMINAL HYSTERECTOMY  06/29/2019   ABDOMINAL HYSTERECTOMY Bilateral 06/29/2019   Procedure: HYSTERECTOMY ABDOMINAL with salpingectomy;  Surgeon: Zelphia Cairo, MD;  Location: [facility];  Service: Gynecology;  Laterality: Bilateral;   FOOT SURGERY  1998   TUBAL LIGATION  1999   TUBAL LIGATION      Prior to Admission medications   Medication Sig Start Date End Date Taking? Authorizing Provider  MYRBETRIQ 25 MG TB24 tablet Take 25 mg by mouth daily.   Yes [provider]  ibuprofen (ADVIL) 600 MG tablet Take 1 tablet (600 mg total) by mouth every 6 (six) hours as needed. Patient not taking: Reported on 02/19/2023 06/30/19   Zelphia Cairo, MD  naproxen sodium (ALEVE) 220 MG tablet Take by mouth. Patient not taking: Reported on 02/19/2023 06/12/18   [provider]    Current Outpatient Medications  Medication Sig Dispense Refill   MYRBETRIQ 25 MG TB24 tablet Take 25 mg by mouth daily.     ibuprofen (ADVIL) 600 MG tablet Take 1 tablet (600 mg total) by mouth every 6 (six) hours as needed. (Patient not taking: Reported on 02/19/2023) 30 tablet 2    naproxen sodium (ALEVE) 220 MG tablet Take by mouth. (Patient not taking: Reported on 02/19/2023)     Current Facility-Administered Medications  Medication Dose Route Frequency Provider Last Rate Last Admin   0.9 %  sodium chloride infusion  500 mL Intravenous Continuous Virl Coble, Willaim Rayas, MD        Allergies as of 03/24/2023   (No Known Allergies)    Family History  Problem Relation Age of Onset   Hypertension Mother    Colon cancer Maternal Grandmother    Stomach cancer Neg Hx    Rectal cancer Neg Hx    Esophageal cancer Neg Hx     Social History   Socioeconomic History   Marital status: Married    Spouse name: Not on file   Number of children: Not on file   Years of education: Not on file   Highest education level: Not on file  Occupational History   Not on file  Tobacco Use   Smoking status: Never   Smokeless tobacco: Never  Vaping Use   Vaping Use: Never used  Substance and Sexual Activity   Alcohol use: No   Drug use: No   Sexual activity: Not on file  Other Topics Concern   Not on file  Social History Narrative   ** Merged History Encounter **       Social Determinants of Health   Financial Resource Strain: Not on file  Food Insecurity: Not on  file  Transportation Needs: Not on file  Physical Activity: Not on file  Stress: Not on file  Social Connections: Not on file  Intimate Partner Violence: Not on file    Review of Systems: All other review of systems negative except as mentioned in the HPI.  Physical Exam: Vital signs BP (!) 212/58   Pulse 87   Temp 98.2 F (36.8 C) (Temporal)   Ht 5\' 1"  (1.549 m)   Wt 242 lb (109.8 kg)   LMP 10/25/2014   SpO2 97%   BMI 45.73 kg/m   General:   Alert,  Well-developed, pleasant and cooperative in NAD Lungs:  Clear throughout to auscultation.   Heart:  Regular rate and rhythm Abdomen:  Soft, nontender and nondistended.   Neuro/Psych:  Alert and cooperative. Normal mood and affect. A and O x  3  Harlin Rain, MD [facility]

## 2023-03-24 NOTE — Progress Notes (Signed)
Report given to PACU, vss 

## 2023-03-24 NOTE — Patient Instructions (Signed)
YOU HAD AN ENDOSCOPIC PROCEDURE TODAY AT THE [facility]:   Refer to the procedure report that was given to you for any specific questions about what was found during the examination.  If the procedure report does not answer your questions, please call your gastroenterologist to clarify.  If you requested that your care partner not be given the details of your procedure findings, then the procedure report has been included in a sealed envelope for you to review at your convenience later.  YOU SHOULD EXPECT: Some feelings of bloating in the abdomen. Passage of more gas than usual.  Walking can help get rid of the air that was put into your GI tract during the procedure and reduce the bloating. If you had a lower endoscopy (such as a colonoscopy or flexible sigmoidoscopy) you may notice spotting of blood in your stool or on the toilet paper. If you underwent a bowel prep for your procedure, you may not have a normal bowel movement for a few days.  Please Note:  You might notice some irritation and congestion in your nose or some drainage.  This is from the oxygen used during your procedure.  There is no need for concern and it should clear up in a day or so.  SYMPTOMS TO REPORT IMMEDIATELY:  Following lower endoscopy (colonoscopy or flexible sigmoidoscopy):  Excessive amounts of blood in the stool  Significant tenderness or worsening of abdominal pains  Swelling of the abdomen that is new, acute  Fever of 100F or higher   For urgent or emergent issues, a gastroenterologist can be reached at any hour by calling [phone]. Do not use MyChart messaging for urgent concerns.    DIET:  We do recommend a small meal at first, but then you may proceed to your regular diet.  Drink plenty of fluids but you should avoid alcoholic beverages for 24 hours.  MEDICATIONS: Continue present medications.  FOLLOW UP: Await pathology results.  Please see handouts given to you by your recovery  nurse: Polyps, Diverticulosis, Hemorrhoids.  Thank you for allowing us to provide for your healthcare needs today.  ACTIVITY:  You should plan to take it easy for the rest of today and you should NOT DRIVE or use heavy machinery until tomorrow (because of the sedation medicines used during the test).    FOLLOW UP: Our staff will call the number listed on your records the next business day following your procedure.  We will call around 7:15- 8:00 am to check on you and address any questions or concerns that you may have regarding the information given to you following your procedure. If we do not reach you, we will leave a message.     If any biopsies were taken you will be contacted by phone or by letter within the next 1-3 weeks.  Please call us at [phone] if you have not heard about the biopsies in 3 weeks.    SIGNATURES/CONFIDENTIALITY: You and/or your care partner have signed paperwork which will be entered into your electronic medical record.  These signatures attest to the fact that that the information above on your After Visit Summary has been reviewed and is understood.  Full responsibility of the confidentiality of this discharge information lies with you and/or your care-partner. 

## 2023-03-24 NOTE — Progress Notes (Signed)
Pt's states no medical or surgical changes since previsit or office visit. 

## 2023-03-24 NOTE — Progress Notes (Signed)
Called to room to assist during endoscopic procedure.  Patient ID and intended procedure confirmed with present staff. Received instructions for my participation in the procedure from the performing physician.  

## 2023-03-24 NOTE — Op Note (Signed)
[facility] Patient Name: Cassandra Willis Procedure Date: 03/24/2023 9:46 AM MRN: [id number] Endoscopist: Viviann Spare P. Adela Lank , MD, [id number] Age: 57 Referring MD:  Date of Birth: [birth year] Gender: Female Account #: [id number] Procedure:                Colonoscopy Indications:              Screening for colorectal malignant neoplasm, This                            is the patient's first colonoscopy Medicines:                Monitored Anesthesia Care Procedure:                Pre-Anesthesia Assessment:                           - Prior to the procedure, a History and Physical                            was performed, and patient medications and                            allergies were reviewed. The patient's tolerance of                            previous anesthesia was also reviewed. The risks                            and benefits of the procedure and the sedation                            options and risks were discussed with the patient.                            All questions were answered, and informed consent                            was obtained. Prior Anticoagulants: The patient has                            taken no anticoagulant or antiplatelet agents. ASA                            Grade Assessment: III - A patient with severe                            systemic disease. After reviewing the risks and                            benefits, the patient was deemed in satisfactory                            condition to undergo the procedure.  After obtaining informed consent, the colonoscope                            was passed under direct vision. Throughout the                            procedure, the patient's blood pressure, pulse, and                            oxygen saturations were monitored continuously. The                            CF [id number] #[id number] was introduced through the anus                            and  advanced to the the cecum, identified by                            appendiceal orifice and ileocecal valve. The                            colonoscopy was performed without difficulty. The                            patient tolerated the procedure well. The quality                            of the bowel preparation was good. The ileocecal                            valve, appendiceal orifice, and rectum were                            photographed. Scope In: 9:59:01 AM Scope Out: 10:17:57 AM Scope Withdrawal Time: 0 hours 13 minutes 28 seconds  Total Procedure Duration: 0 hours 18 minutes 56 seconds  Findings:                 The perianal and digital rectal examinations were                            normal.                           Four sessile polyps were found in the hepatic                            flexure. The polyps were 2 to 3 mm in size. These                            polyps were removed with a cold snare. Resection                            and retrieval were complete.  Six sessile polyps were found in the transverse                            colon. The polyps were 2 to 5 mm in size. These                            polyps were removed with a cold snare. Resection                            and retrieval were complete.                           A 2 to 3 mm polyp was found in the splenic flexure.                            The polyp was sessile. The polyp was removed with a                            cold snare. Resection and retrieval were complete.                           A few small-mouthed diverticula were found in the                            sigmoid colon.                           Internal hemorrhoids were found during                            retroflexion. The hemorrhoids were small.                           The exam was otherwise without abnormality. Complications:            No immediate complications. Estimated blood loss:                             Minimal. Estimated Blood Loss:     Estimated blood loss was minimal. Impression:               - Four 2 to 3 mm polyps at the hepatic flexure,                            removed with a cold snare. Resected and retrieved.                           - Six 2 to 5 mm polyps in the transverse colon,                            removed with a cold snare. Resected and retrieved.                           - One 2  to 3 mm polyp at the splenic flexure,                            removed with a cold snare. Resected and retrieved.                           - Diverticulosis in the sigmoid colon.                           - Internal hemorrhoids.                           - The examination was otherwise normal. Recommendation:           - Patient has a contact number available for                            emergencies. The signs and symptoms of potential                            delayed complications were discussed with the                            patient. Return to normal activities tomorrow.                            Written discharge instructions were provided to the                            patient.                           - Resume previous diet.                           - Continue present medications.                           - Await pathology results. Viviann Spare P. Jeaneen Cala, MD 03/24/2023 10:24:57 AM This report has been signed electronically.

## 2023-03-25 ENCOUNTER — Telehealth: Payer: Self-pay

## 2023-03-25 NOTE — Telephone Encounter (Signed)
  Follow up Call-     03/24/2023    9:28 AM  Call back number  Post procedure Call Back phone  # [phone] no VM  Permission to leave phone message Yes     Patient questions:  Do you have a fever, pain , or abdominal swelling? No. Pain Score  0 *  Have you tolerated food without any problems? Yes.    Have you been able to return to your normal activities? Yes.    Do you have any questions about your discharge instructions: Diet   No. Medications  No. Follow up visit  No.  Do you have questions or concerns about your Care? No.  Actions: * If pain score is 4 or above: No action needed, pain <4.

## 2023-03-31 ENCOUNTER — Encounter: Payer: Self-pay | Admitting: Gastroenterology

## 2023-06-05 ENCOUNTER — Ambulatory Visit: Payer: BC Managed Care – PPO | Admitting: Internal Medicine

## 2023-06-16 ENCOUNTER — Ambulatory Visit: Payer: BC Managed Care – PPO | Admitting: Internal Medicine
